# Patient Record
Sex: Male | Born: 1980
Health system: Southern US, Community
[De-identification: ages and names within clinical notes are randomized; demographics above are authoritative.]

## PROBLEM LIST (undated history)

## (undated) DIAGNOSIS — G4733 Obstructive sleep apnea (adult) (pediatric): Secondary | ICD-10-CM

## (undated) DIAGNOSIS — Z86018 Personal history of other benign neoplasm: Secondary | ICD-10-CM

## (undated) DIAGNOSIS — C4491 Basal cell carcinoma of skin, unspecified: Secondary | ICD-10-CM

## (undated) DIAGNOSIS — Z85828 Personal history of other malignant neoplasm of skin: Secondary | ICD-10-CM

## (undated) DIAGNOSIS — Z8619 Personal history of other infectious and parasitic diseases: Secondary | ICD-10-CM

## (undated) DIAGNOSIS — J309 Allergic rhinitis, unspecified: Secondary | ICD-10-CM

## (undated) DIAGNOSIS — R319 Hematuria, unspecified: Secondary | ICD-10-CM

## (undated) HISTORY — DX: Personal history of other infectious and parasitic diseases: Z86.19

## (undated) HISTORY — DX: Hematuria, unspecified: R31.9

## (undated) HISTORY — DX: Allergic rhinitis, unspecified: J30.9

## (undated) HISTORY — DX: Obstructive sleep apnea (adult) (pediatric): G47.33

## (undated) HISTORY — DX: Personal history of other malignant neoplasm of skin: Z85.828

## (undated) HISTORY — DX: Basal cell carcinoma of skin, unspecified: C44.91

## (undated) HISTORY — DX: Personal history of other benign neoplasm: Z86.018

---
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
## 2006-01-29 ENCOUNTER — Emergency Department: Payer: Self-pay | Admitting: Internal Medicine

## 2007-04-18 ENCOUNTER — Emergency Department: Payer: Self-pay | Admitting: Emergency Medicine

## 2009-01-11 ENCOUNTER — Emergency Department: Payer: Self-pay | Admitting: Emergency Medicine

## 2011-01-21 IMAGING — CR RIGHT RING FINGER 2+V
1 series · 3 of 3 positions shown · non-contrast
Comparison: none

REASON FOR EXAM: injury
COMMENTS:

PROCEDURE:     DXR - DXR FINGER RING 4TH DIGIT RT UA  - January 11, 2009 [DATE]
RESULT:     Comparison:  None

[Series 1: view not recorded · 0.17mm/px · 3 of 3 slices shown]
[im 1/3]
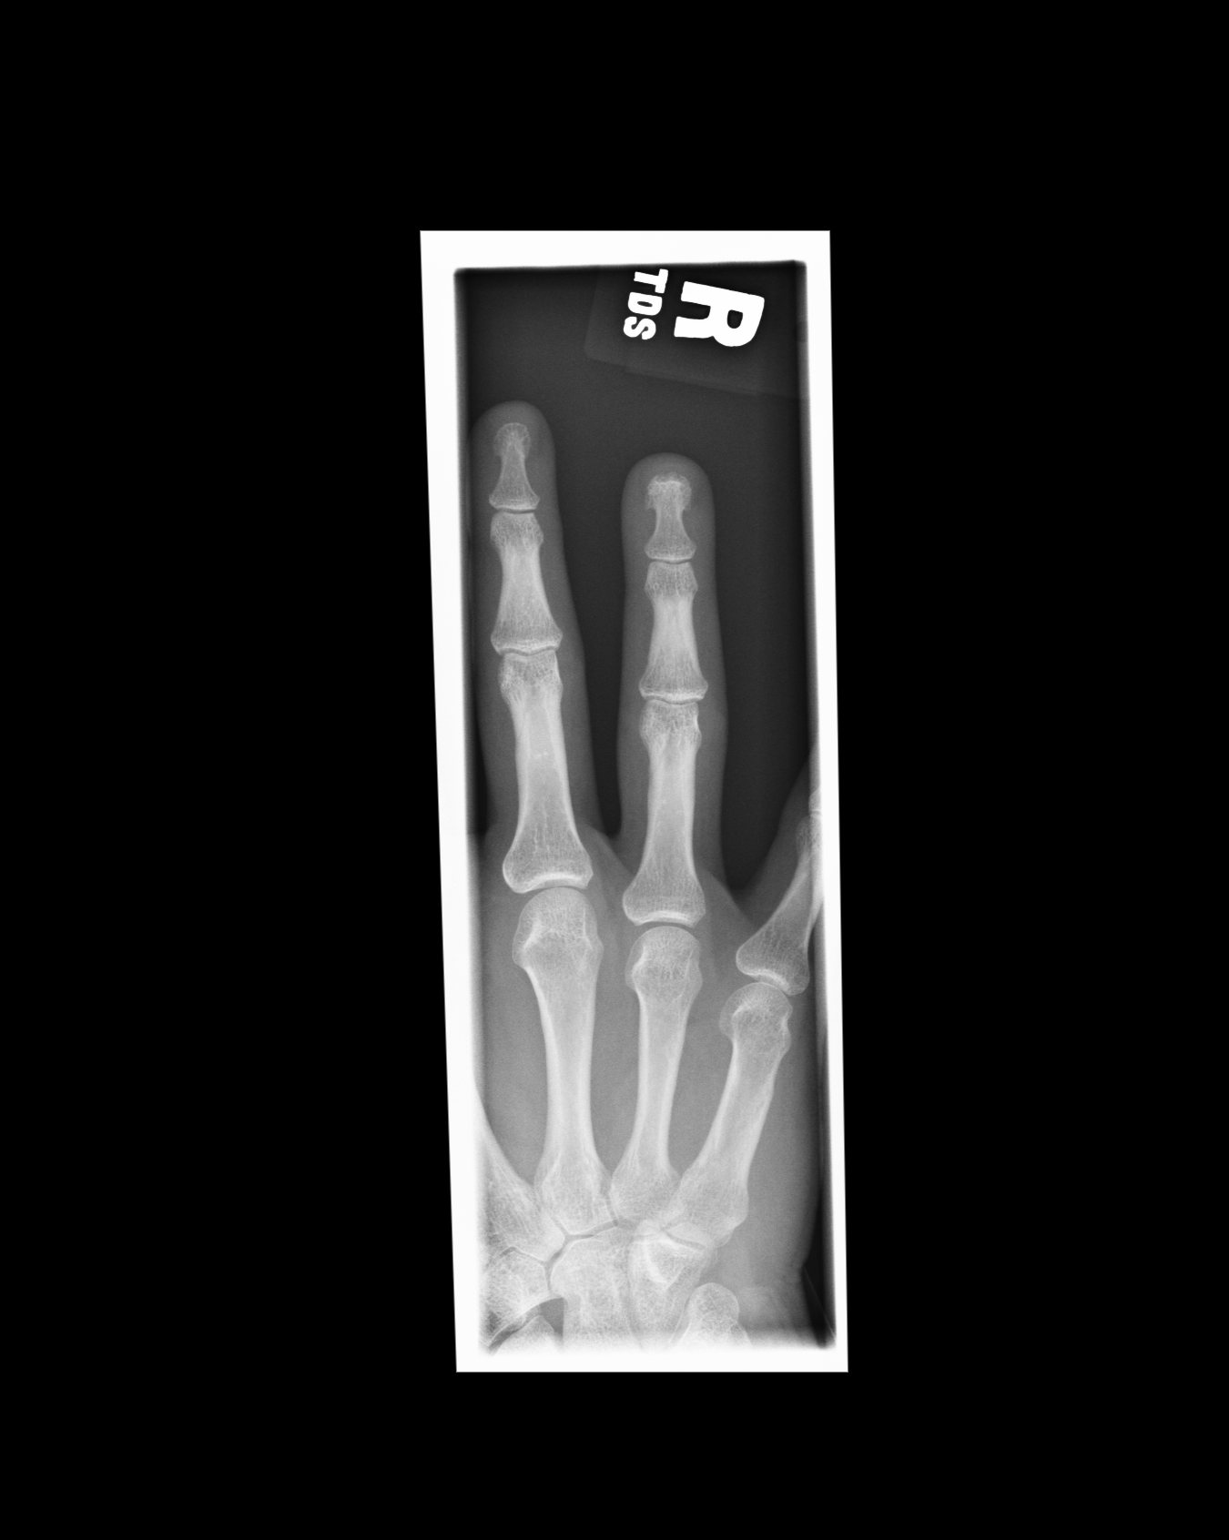
[im 2/3]
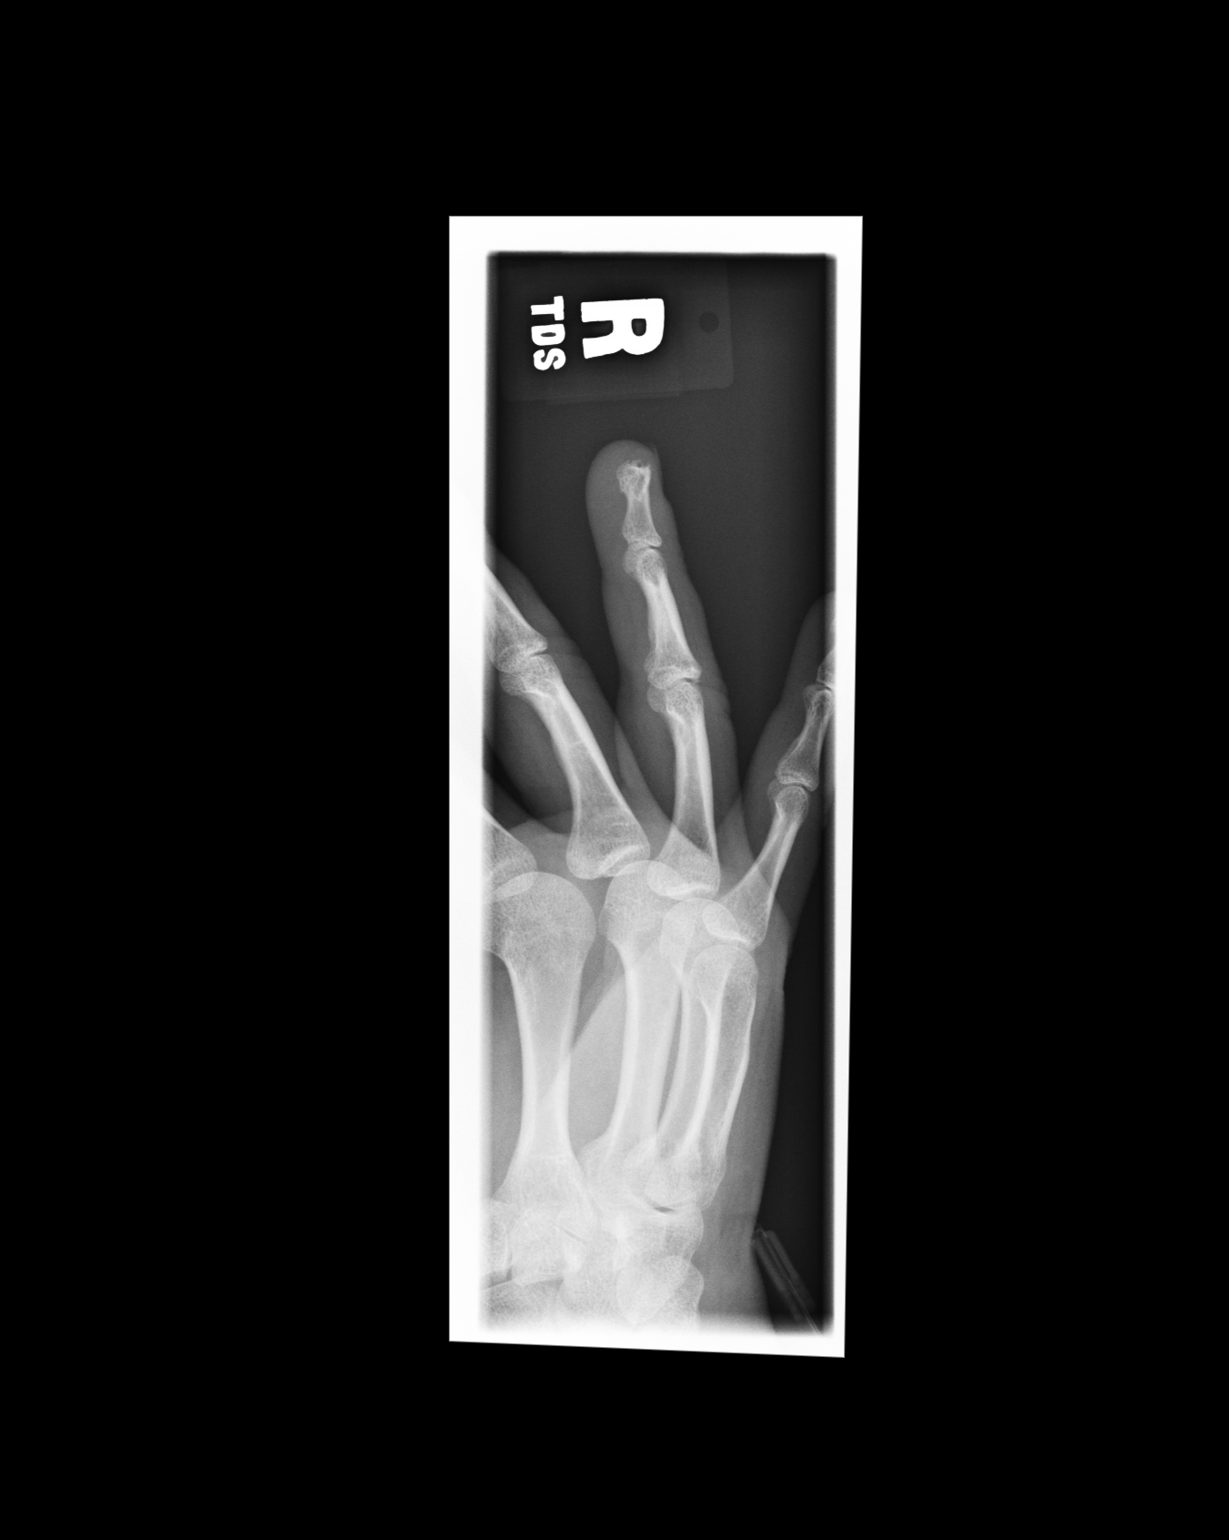
[im 3/3]
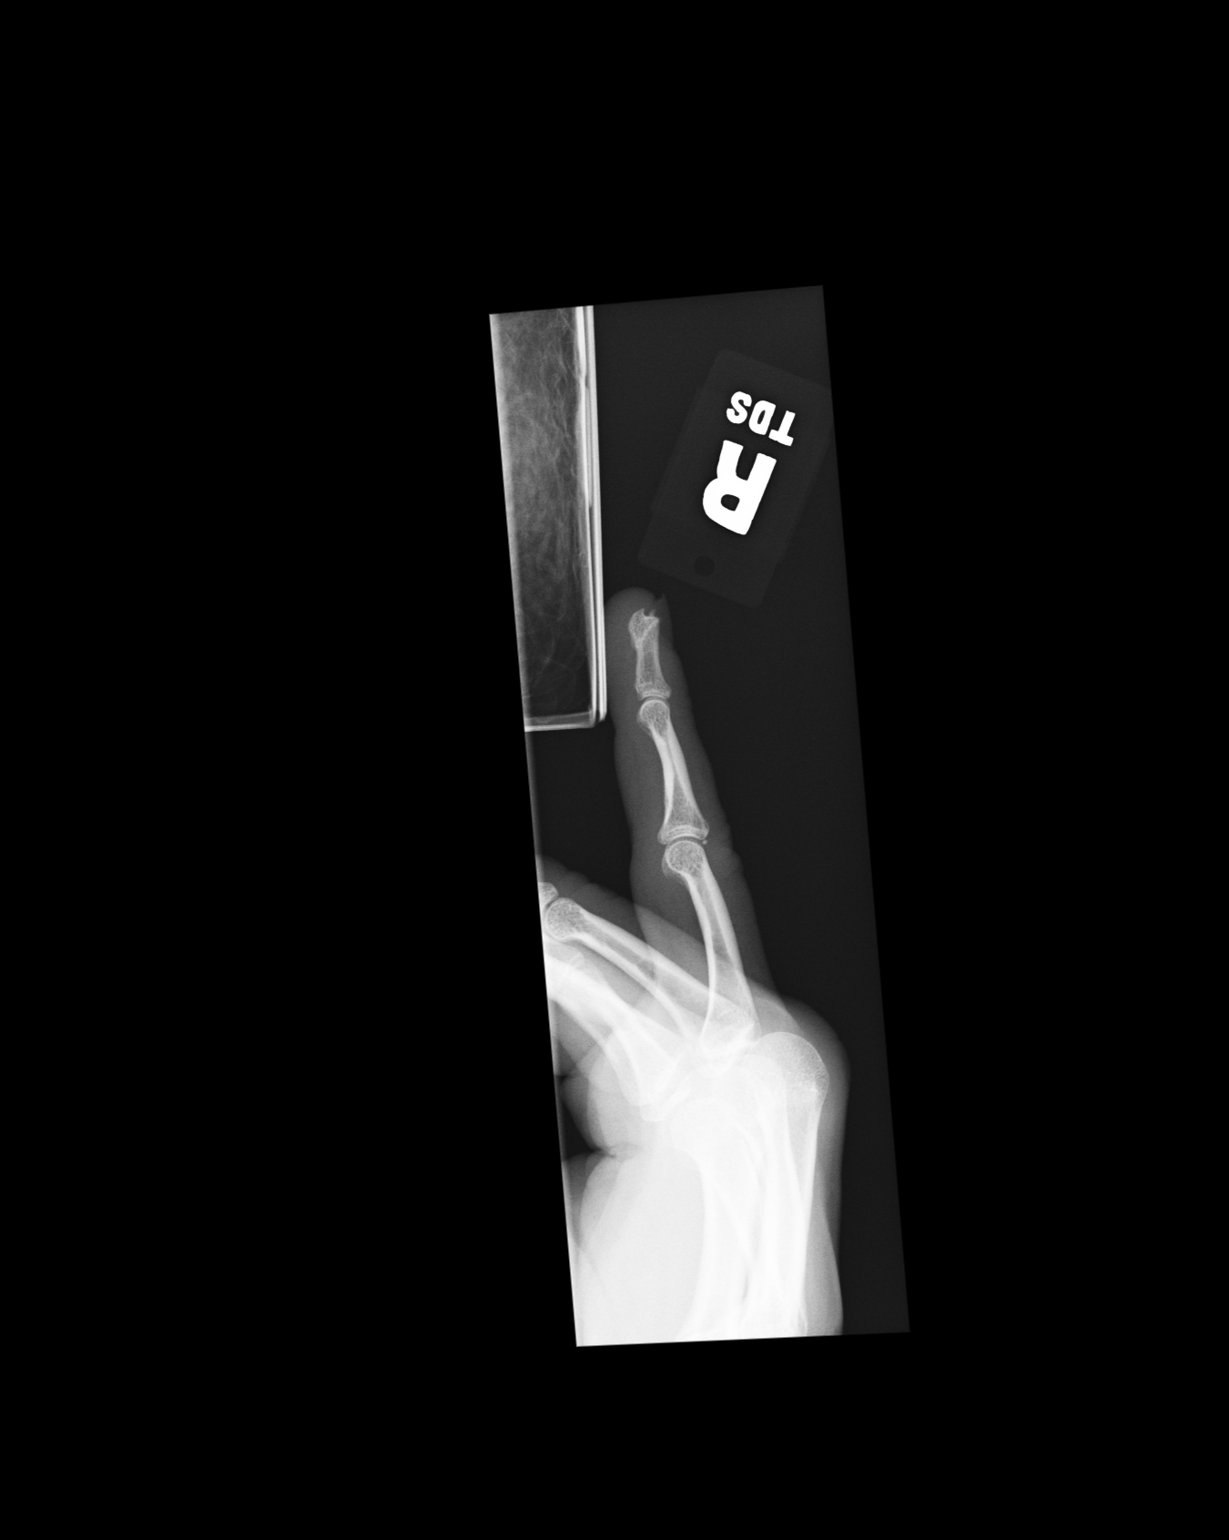

[3 of 3 positions shown; findings below may reference images not displayed]

FINDINGS: Three coned-down views of the right fourth digit demonstrates no fracture or
dislocation. There is a well-corticated scalloping along the dorsal tip of
the fourth distal phalanx which may represent an epidermal inclusion cyst.
The soft tissues are normal.
IMPRESSION: No acute osseous injury of the right fourth digit .

There is a well-corticated scalloping along the dorsal tip of the fourth
distal phalanx which may represent an epidermal inclusion cyst. If there is
further clinical concern recommend evaluation with MRI.

## 2012-05-21 ENCOUNTER — Telehealth: Payer: Self-pay | Admitting: Family Medicine

## 2012-05-21 NOTE — Telephone Encounter (Signed)
Pt's wife called and says pt needs to be est w/a PCP. He currently has a new pt appmt scheduled for 07/20/2012, however, he needs a DOT physical prior to 06/05/2012. Would you be able to accommodate him a new pt and DOT CPE apptmt prior to 06/05/2012 on the same day? Thank you.

## 2012-05-21 NOTE — Telephone Encounter (Signed)
We could place him tomorrow at 4pm

## 2012-05-22 ENCOUNTER — Ambulatory Visit (INDEPENDENT_AMBULATORY_CARE_PROVIDER_SITE_OTHER): Payer: BC Managed Care – PPO | Admitting: Family Medicine

## 2012-05-22 ENCOUNTER — Encounter: Payer: Self-pay | Admitting: Family Medicine

## 2012-05-22 VITALS — BP 110/80 | HR 84 | Temp 97.8°F | Ht 73.0 in | Wt 221.8 lb

## 2012-05-22 DIAGNOSIS — E669 Obesity, unspecified: Secondary | ICD-10-CM

## 2012-05-22 DIAGNOSIS — Z0000 Encounter for general adult medical examination without abnormal findings: Secondary | ICD-10-CM

## 2012-05-22 DIAGNOSIS — Z0279 Encounter for issue of other medical certificate: Secondary | ICD-10-CM

## 2012-05-22 LAB — POCT URINALYSIS DIPSTICK
Bilirubin, UA: NEGATIVE
Blood, UA: NEGATIVE
Glucose, UA: NEGATIVE
Ketones, UA: NEGATIVE
Spec Grav, UA: 1.01

## 2012-05-22 NOTE — Telephone Encounter (Signed)
Appt scheduled

## 2012-05-22 NOTE — Patient Instructions (Signed)
Good to meet you today. Call us with quesitons. Form filled out today. Return as needed or for next physical (although we will stop offering DOT physicals later this year.)

## 2012-05-22 NOTE — Progress Notes (Signed)
Subjective:    Patient ID: Francisco Mendez, male    DOB: October 19, 1980, 32 y.o.   MRN: [id number]  HPI CC: new pt, DOT   Francisco Mendez is here as a new patient to establish care.  He requests CPE for DOT CDL. Just finished cipro for bronchitis.    Body mass index is 29.26 kg/(m^2).   Caffeine: 2 cups soda/day Lives with wife, 1 daughter (2011), 2 dogs Occupation: Emergency planning/management officer Edu: HS Activity: goes to gym intermittently Diet: good water, not much fruits/vegetables  Preventative: No recent CPE Flu shot - declines Tetanus - unsure, thinks recently (2-3 yrs) at least 2008  Seat belt use discussed. Sunscreen use discussed.  Medications and allergies reviewed and updated in chart.  Past histories reviewed and updated if relevant as below. There is no problem list on file for this patient.  Past Medical History  Diagnosis Date  . History of chicken pox    Past Surgical History  Procedure Date  . Cystoscopy 2001    for hematuria, WNL   History  Substance Use Topics  . Smoking status: Never Smoker   . Smokeless tobacco: Current User    Types: Snuff     Comment: 1 can/day  . Alcohol Use: Yes     Comment: Occasional   Family History  Problem Relation Age of Onset  . Alzheimer's disease Paternal Grandfather   . CAD Maternal Grandmother   . CAD Maternal Grandfather   . Cancer Neg Hx   . Diabetes Neg Hx    Allergies  Allergen Reactions  . Keflex (Cephalexin) Rash    hives   No current outpatient prescriptions on file prior to visit.     Review of Systems  Constitutional: Negative for fever, chills, activity change, appetite change, fatigue and unexpected weight change.  HENT: Negative for hearing loss and neck pain.   Eyes: Negative for visual disturbance.  Respiratory: Positive for cough (bronchitis). Negative for chest tightness, shortness of breath and wheezing.   Cardiovascular: Negative for chest pain, palpitations and leg swelling.  Gastrointestinal: Negative for  nausea, vomiting, abdominal pain, diarrhea, constipation, blood in stool and abdominal distention.  Genitourinary: Negative for hematuria and difficulty urinating.  Musculoskeletal: Negative for myalgias and arthralgias.  Skin: Negative for rash.  Neurological: Negative for dizziness, seizures, syncope and headaches.  Hematological: Does not bruise/bleed easily.  Psychiatric/Behavioral: Negative for dysphoric mood. The patient is not nervous/anxious.        Objective:   Physical Exam  Nursing note and vitals reviewed. Constitutional: He is oriented to person, place, and time. He appears well-developed and well-nourished. No distress.  HENT:  Head: Normocephalic and atraumatic.  Right Ear: Hearing, tympanic membrane, external ear and ear canal normal.  Left Ear: Hearing, tympanic membrane, external ear and ear canal normal.  Nose: Nose normal.  Mouth/Throat: Uvula is midline and oropharynx is clear and moist. No oropharyngeal exudate, posterior oropharyngeal edema, posterior oropharyngeal erythema or tonsillar abscesses.  Eyes: Conjunctivae normal and EOM are normal. Pupils are equal, round, and reactive to light. No scleral icterus.       R cherry angioma inferior eyelid  Neck: Normal range of motion. Neck supple. No thyromegaly present.  Cardiovascular: Normal rate, regular rhythm, normal heart sounds and intact distal pulses.   No murmur heard. Pulses:      Radial pulses are 2+ on the right side, and 2+ on the left side.  Pulmonary/Chest: Effort normal and breath sounds normal. No respiratory distress. He has no wheezes.  He has no rales.  Abdominal: Soft. Bowel sounds are normal. He exhibits no distension and no mass. There is no tenderness. There is no rebound and no guarding.  Musculoskeletal: Normal range of motion. He exhibits no edema.  Lymphadenopathy:    He has no cervical adenopathy.  Neurological: He is alert and oriented to person, place, and time.       CN grossly  intact, station and gait intact  Skin: Skin is warm and dry. No rash noted.  Psychiatric: He has a normal mood and affect. His behavior is normal. Judgment and thought content normal.      Assessment & Plan:

## 2012-05-22 NOTE — Assessment & Plan Note (Addendum)
Preventative protocols reviewed and updated unless pt declined. Discussed healthy diet and lifestyle. DOT form filled out. He will return at his convenience fasting for blood work. Will try to give urine sample.

## 2012-05-31 ENCOUNTER — Telehealth: Payer: Self-pay | Admitting: *Deleted

## 2012-05-31 NOTE — Telephone Encounter (Signed)
Wife, Toni Amend dropped off DOT card and ppw to be completed by Dr. Sharen Hones.  Please call when completed at [phone].

## 2012-06-01 NOTE — Telephone Encounter (Signed)
Patient notified and paperwork placed up front for pick up.  

## 2012-06-01 NOTE — Telephone Encounter (Signed)
IN your IN box for completion. Just needs signature on card.

## 2012-06-01 NOTE — Telephone Encounter (Signed)
filled and placed in kim's box 

## 2012-06-10 ENCOUNTER — Encounter: Payer: Self-pay | Admitting: Family Medicine

## 2012-07-20 ENCOUNTER — Ambulatory Visit: Payer: BC Managed Care – PPO | Admitting: Family Medicine

## 2013-04-12 ENCOUNTER — Ambulatory Visit: Payer: Self-pay | Admitting: Podiatry

## 2014-05-09 DIAGNOSIS — C4491 Basal cell carcinoma of skin, unspecified: Secondary | ICD-10-CM

## 2014-05-09 HISTORY — DX: Basal cell carcinoma of skin, unspecified: C44.91

## 2014-11-17 DIAGNOSIS — Z85828 Personal history of other malignant neoplasm of skin: Secondary | ICD-10-CM

## 2014-11-17 HISTORY — DX: Personal history of other malignant neoplasm of skin: Z85.828

## 2016-07-19 ENCOUNTER — Ambulatory Visit (HOSPITAL_COMMUNITY)
Admission: RE | Admit: 2016-07-19 | Discharge: 2016-07-19 | Disposition: A | Discharge status: Home / Self Care | Payer: BLUE CROSS/BLUE SHIELD | Source: Ambulatory Visit | Attending: Internal Medicine | Admitting: Internal Medicine

## 2016-07-19 ENCOUNTER — Encounter (HOSPITAL_COMMUNITY): Payer: Self-pay | Admitting: Internal Medicine

## 2016-07-19 VITALS — BP 122/82 | HR 77 | Wt 225.2 lb

## 2016-07-19 DIAGNOSIS — E782 Mixed hyperlipidemia: Secondary | ICD-10-CM | POA: Diagnosis not present

## 2016-07-19 DIAGNOSIS — R079 Chest pain, unspecified: Secondary | ICD-10-CM

## 2016-07-19 DIAGNOSIS — E785 Hyperlipidemia, unspecified: Secondary | ICD-10-CM | POA: Insufficient documentation

## 2016-07-19 DIAGNOSIS — R0789 Other chest pain: Secondary | ICD-10-CM | POA: Diagnosis not present

## 2016-07-19 DIAGNOSIS — R001 Bradycardia, unspecified: Secondary | ICD-10-CM | POA: Insufficient documentation

## 2016-07-19 DIAGNOSIS — R0683 Snoring: Secondary | ICD-10-CM | POA: Diagnosis not present

## 2016-07-19 DIAGNOSIS — Z8249 Family history of ischemic heart disease and other diseases of the circulatory system: Secondary | ICD-10-CM | POA: Diagnosis not present

## 2016-07-19 LAB — EXERCISE TOLERANCE TEST
CSEPHR: 107 %
CSEPPHR: 200 {beats}/min
Estimated workload: 12.5 METS
Exercise duration (min): 10 min
Exercise duration (sec): 31 s
MPHR: 186 {beats}/min
RPE: 18
Rest HR: 68 {beats}/min

## 2016-07-19 LAB — COMPREHENSIVE METABOLIC PANEL
ALBUMIN: 4.4 g/dL (ref 3.5–5.0)
ALT: 36 U/L (ref 17–63)
AST: 31 U/L (ref 15–41)
Alkaline Phosphatase: 84 U/L (ref 38–126)
Anion gap: 11 (ref 5–15)
BUN: 12 mg/dL (ref 6–20)
CHLORIDE: 97 mmol/L — AB (ref 101–111)
CO2: 27 mmol/L (ref 22–32)
CREATININE: 0.88 mg/dL (ref 0.61–1.24)
Calcium: 9.6 mg/dL (ref 8.9–10.3)
GFR calc non Af Amer: >60 mL/min (ref >60)
Glucose, Bld: 100 mg/dL — ABNORMAL HIGH (ref 65–99)
Potassium: 4.1 mmol/L (ref 3.5–5.1)
SODIUM: 135 mmol/L (ref 135–145)
Total Bilirubin: 1.4 mg/dL — ABNORMAL HIGH (ref 0.3–1.2)
Total Protein: 8.2 g/dL — ABNORMAL HIGH (ref 6.5–8.1)

## 2016-07-19 LAB — CBC
HCT: 44.8 % (ref 39.0–52.0)
Hemoglobin: 15.5 g/dL (ref 13.0–17.0)
MCH: 29.2 pg (ref 26.0–34.0)
MCHC: 34.6 g/dL (ref 30.0–36.0)
MCV: 84.4 fL (ref 78.0–100.0)
PLATELETS: 280 10*3/uL (ref 150–400)
RBC: 5.31 MIL/uL (ref 4.22–5.81)
RDW: 12.8 % (ref 11.5–15.5)
WBC: 5.5 10*3/uL (ref 4.0–10.5)

## 2016-07-19 LAB — TROPONIN I

## 2016-07-19 LAB — CHOLESTEROL, TOTAL: Cholesterol: 285 mg/dL — ABNORMAL HIGH (ref 0–200)

## 2016-07-19 LAB — D-DIMER, QUANTITATIVE (NOT AT ARMC): D DIMER QUANT: 0.44 ug{FEU}/mL (ref 0.00–0.50)

## 2016-07-19 MED ORDER — PANTOPRAZOLE SODIUM 40 MG PO TBEC: 40.0000 mg | DELAYED_RELEASE_TABLET | Freq: Every day | ORAL | 11 refills | Status: DC

## 2016-07-19 NOTE — Progress Notes (Signed)
CARDIOLOGY CLINIC CONSULT NOTE  Referring Physician: Primary Care: Primary Cardiologist:  HPI:  36 y/o male with no significant PMHx (brother-in-law of Kevan Rosebush) presents for further evaluation of chest pressure.   Denies any h/o known heart disease. No HTN. +HL (TGs > 500). Non-smoker.   Has been doing ok. Goes to gym 2-3x. Rides spin bike for 15. Lifts weights and then does elliptical for 25 mins hard. No CP or SOB. Last week was lying in bed and got tightness in his chest. Some pain in his arm. Has had indigestion in past but this was more severe. No SOB, diaphoresis. Over past two days has had tightness in his chest. No change with exertion or eating. No dark stools. No recent travel. + burning in his throat.   Snores severely.   Trop < 0.03  D-dimer 0.44   FHx:  Maternal uncle: MI in 62s (smoker) MGF: MI at older age (smoker) MGM: MI at older age  Sister: 95 and healthy Father: 20 ok Mother: 40 high cholesterol   Review of Systems: [y] = yes, [ ]  = no   General: Weight gain [ ] ; Weight loss [ ] ; Anorexia [ ] ; Fatigue [ ] ; Fever [ ] ; Chills [ ] ; Weakness [ ]   Cardiac: Chest pain/pressure Blue.Reese ]; Resting SOB [ ] ; Exertional SOB [ ] ; Orthopnea [ ] ; Pedal Edema [ ] ; Palpitations [ ] ; Syncope [ ] ; Presyncope [ ] ; Paroxysmal nocturnal dyspnea[ ]   Pulmonary: Cough [ ] ; Wheezing[ ] ; Hemoptysis[ ] ; Sputum [ ] ; Snoring [ ]   GI: Vomiting[ ] ; Dysphagia[ ] ; Melena[ ] ; Hematochezia [ ] ; Heartburn[ ] ; Abdominal pain [ ] ; Constipation [ ] ; Diarrhea [ ] ; BRBPR [ ]   GU: Hematuria[ ] ; Dysuria [ ] ; Nocturia[ ]   Vascular: Pain in legs with walking [ ] ; Pain in feet with lying flat [ ] ; Non-healing sores [ ] ; Stroke [ ] ; TIA [ ] ; Slurred speech [ ] ;  Neuro: Headaches[ ] ; Vertigo[ ] ; Seizures[ ] ; Paresthesias[ ] ;Blurred vision [ ] ; Diplopia [ ] ; Vision changes [ ]   Ortho/Skin: Arthritis [ ] ; Joint pain [ ] ; Muscle pain [ ] ; Joint swelling [ ] ; Back Pain [ ] ; Rash [ ]   Psych: Depression[  ]; Anxiety[ ]   Heme: Bleeding problems [ ] ; Clotting disorders [ ] ; Anemia [ ]   Endocrine: Diabetes [ ] ; Thyroid dysfunction[ ]    Past Medical History:  Diagnosis Date  . Allergic rhinitis   . Hematuria ~2001   normal workup with cystoscopy and CT  . History of chicken pox     No current outpatient prescriptions on file.   No current facility-administered medications for this encounter.     Allergies  Allergen Reactions  . Keflex [Cephalexin] Rash    hives      Social History   Social History  . Marital status: Married    Spouse name: N/A  . Number of children: N/A  . Years of education: N/A   Occupational History  . Not on file.   Social History Main Topics  . Smoking status: Never Smoker  . Smokeless tobacco: Current User    Types: Snuff     Comment: 1 can/day  . Alcohol use Yes     Comment: Occasional  . Drug use: No  . Sexual activity: Not on file   Other Topics Concern  . Not on file   Social History Narrative   Caffeine: 2 cups soda/day   Lives with wife, 1 daughter, 2 dogs   Occupation:  police officer   Edu: HS   Activity: goes to gym intermittently   Diet: good water, not much fruits/vegetables      Family History  Problem Relation Age of Onset  . Alzheimer's disease Paternal Grandfather   . CAD Maternal Grandmother   . CAD Maternal Grandfather   . Cancer Neg Hx   . Diabetes Neg Hx     Vitals:   07/19/16 1117  BP: 122/82  Pulse: 77  SpO2: 97%  Weight: 225 lb 4 oz (102.2 kg)    PHYSICAL EXAM: General:  Well appearing. Muscular. No respiratory difficulty HEENT: normal Neck: supple. no JVD. Carotids 2+ bilat; no bruits. No lymphadenopathy or thryomegaly appreciated. Cor: PMI nondisplaced. Regular rate & rhythm. No rubs, gallops or murmurs. Lungs: clear Abdomen: soft, nontender, nondistended. No hepatosplenomegaly. No bruits or masses. Good bowel sounds. Extremities: no cyanosis, clubbing, rash, edema Neuro: alert & oriented x 3,  cranial nerves grossly intact. moves all 4 extremities w/o difficulty. Affect pleasant.  ECG: NSR 66 No ST-T wave abnormalities.    ASSESSMENT & PLAN: 1. Chest tightness with + FHX for early CAD - suspect symptoms are related to GERD but given strong FHx will proceed with ETT to further risk stratify. If symptoms persist can consider coronary CTA -start potonix 40 daily 2. Snoring -likely OSA. Will try to lose 15 pounds and see response. If persists, will need sleep study 3. Hyperlipidemia -check lipid panel and HgbA1c.   Emeka Lindner,MD 3:18 PM

## 2016-07-19 NOTE — Progress Notes (Signed)
Pt here for GXT.  Pt did well with GXT, no chest pain with exertion.  ECG not acute. Dr Haroldine Laws in to see pt.  Lenoard Aden 07/19/2016 2:35 PM Beeper [phone]

## 2016-07-20 LAB — HEMOGLOBIN A1C
HEMOGLOBIN A1C: 5.4 % (ref 4.8–5.6)
MEAN PLASMA GLUCOSE: 108 mg/dL

## 2016-07-25 ENCOUNTER — Other Ambulatory Visit: Payer: Self-pay

## 2016-07-25 DIAGNOSIS — E782 Mixed hyperlipidemia: Secondary | ICD-10-CM

## 2016-07-26 LAB — LIPID PANEL
CHOL/HDL RATIO: 8.6 ratio — AB (ref 0.0–5.0)
Cholesterol, Total: 259 mg/dL — ABNORMAL HIGH (ref 100–199)
HDL: 30 mg/dL — AB (ref >39)
Triglycerides: 663 mg/dL (ref 0–149)

## 2016-07-28 ENCOUNTER — Telehealth (HOSPITAL_COMMUNITY): Payer: Self-pay | Admitting: *Deleted

## 2016-07-28 MED ORDER — FISH OIL 1000 MG PO CAPS
2000.0000 mg | ORAL_CAPSULE | Freq: Every day | ORAL | 0 refills | Status: DC
Start: 2016-07-28 — End: 2017-06-19

## 2016-07-28 MED ORDER — ROSUVASTATIN CALCIUM 20 MG PO TABS: 20.0000 mg | ORAL_TABLET | Freq: Every day | ORAL | 6 refills | Status: DC

## 2016-07-28 NOTE — Telephone Encounter (Signed)
Notes recorded by Scarlette Calico, RN on 07/28/2016 at 4:31 PM EDT Per Dr Haroldine Laws pt needs Crestor 20 mg daily, fish oil 2 gm daily, decrease sugar intake, f/u w/pcp may need referral to lipid clinic. Pt aware of results and recommendations. He states he is taking Fish oil 2 gm already, rx for Crestor sent in, will let Dr Haroldine Laws know about fish oil.

## 2016-08-06 NOTE — Addendum Note (Signed)
Encounter addended by: Jolaine Artist, MD on: 08/06/2016 11:38 PM<BR>    Actions taken: LOS modified

## 2016-08-06 NOTE — Addendum Note (Signed)
Encounter addended by: Jolaine Artist, MD on: 08/06/2016 11:37 PM<BR>    Actions taken: LOS modified

## 2016-08-07 DIAGNOSIS — G4733 Obstructive sleep apnea (adult) (pediatric): Secondary | ICD-10-CM

## 2016-08-07 HISTORY — DX: Obstructive sleep apnea (adult) (pediatric): G47.33

## 2016-08-11 ENCOUNTER — Encounter: Payer: Self-pay | Admitting: Family Medicine

## 2016-08-17 ENCOUNTER — Encounter: Payer: Self-pay | Admitting: Family Medicine

## 2016-08-17 ENCOUNTER — Ambulatory Visit (INDEPENDENT_AMBULATORY_CARE_PROVIDER_SITE_OTHER): Payer: BLUE CROSS/BLUE SHIELD | Admitting: Family Medicine

## 2016-08-17 VITALS — BP 110/76 | HR 60 | Temp 98.1°F | Ht 73.0 in | Wt 223.5 lb

## 2016-08-17 DIAGNOSIS — E781 Pure hyperglyceridemia: Secondary | ICD-10-CM

## 2016-08-17 DIAGNOSIS — K219 Gastro-esophageal reflux disease without esophagitis: Secondary | ICD-10-CM | POA: Insufficient documentation

## 2016-08-17 DIAGNOSIS — Z0000 Encounter for general adult medical examination without abnormal findings: Secondary | ICD-10-CM | POA: Diagnosis not present

## 2016-08-17 DIAGNOSIS — E785 Hyperlipidemia, unspecified: Secondary | ICD-10-CM | POA: Insufficient documentation

## 2016-08-17 NOTE — Assessment & Plan Note (Signed)
Preventative protocols reviewed and updated unless pt declined. Discussed healthy diet and lifestyle.  

## 2016-08-17 NOTE — Assessment & Plan Note (Signed)
Marked elevated trig, low HDL. Continues fish oil 2000mg  daily. Has been started on crestor 20mg  daily by cardiology. rec rpt FLP with cards or myself in 2-3 months - pt will check about f/u plan and let us know if he'd like to come here for labs.

## 2016-08-17 NOTE — Patient Instructions (Addendum)
Continue current medicines. Check with Dr B about follow up here or with him.  You are doing well today.  Return as needed or in 1-2 years for next physical.   Health Maintenance, Male A healthy lifestyle and preventive care is important for your health and wellness. Ask your health care provider about what schedule of regular examinations is right for you. What should I know about weight and diet?  Eat a Healthy Diet  Eat plenty of vegetables, fruits, whole grains, low-fat dairy products, and lean protein.  Do not eat a lot of foods high in solid fats, added sugars, or salt. Maintain a Healthy Weight  Regular exercise can help you achieve or maintain a healthy weight. You should:  Do at least 150 minutes of exercise each week. The exercise should increase your heart rate and make you sweat (moderate-intensity exercise).  Do strength-training exercises at least twice a week. Watch Your Levels of Cholesterol and Blood Lipids  Have your blood tested for lipids and cholesterol every 5 years starting at 34 years of age. If you are at high risk for heart disease, you should start having your blood tested when you are 34 years old. You may need to have your cholesterol levels checked more often if:  Your lipid or cholesterol levels are high.  You are older than 34 years of age.  You are at high risk for heart disease. What should I know about cancer screening? Many types of cancers can be detected early and may often be prevented. Lung Cancer  You should be screened every year for lung cancer if:  You are a current smoker who has smoked for at least 30 years.  You are a former smoker who has quit within the past 15 years.  Talk to your health care provider about your screening options, when you should start screening, and how often you should be screened. Colorectal Cancer  Routine colorectal cancer screening usually begins at 34 years of age and should be repeated every 5-10 years  until you are 34 years old. You may need to be screened more often if early forms of precancerous polyps or small growths are found. Your health care provider may recommend screening at an earlier age if you have risk factors for colon cancer.  Your health care provider may recommend using home test kits to check for hidden blood in the stool.  A small camera at the end of a tube can be used to examine your colon (sigmoidoscopy or colonoscopy). This checks for the earliest forms of colorectal cancer. Prostate and Testicular Cancer  Depending on your age and overall health, your health care provider may do certain tests to screen for prostate and testicular cancer.  Talk to your health care provider about any symptoms or concerns you have about testicular or prostate cancer. Skin Cancer  Check your skin from head to toe regularly.  Tell your health care provider about any new moles or changes in moles, especially if:  There is a change in a mole's size, shape, or color.  You have a mole that is larger than a pencil eraser.  Always use sunscreen. Apply sunscreen liberally and repeat throughout the day.  Protect yourself by wearing long sleeves, pants, a wide-brimmed hat, and sunglasses when outside. What should I know about heart disease, diabetes, and high blood pressure?  If you are 75-70 years of age, have your blood pressure checked every 3-5 years. If you are 25 years of age  or older, have your blood pressure checked every year. You should have your blood pressure measured twice-once when you are at a hospital or clinic, and once when you are not at a hospital or clinic. Record the average of the two measurements. To check your blood pressure when you are not at a hospital or clinic, you can use:  An automated blood pressure machine at a pharmacy.  A home blood pressure monitor.  Talk to your health care provider about your target blood pressure.  If you are between 57-79 years  old, ask your health care provider if you should take aspirin to prevent heart disease.  Have regular diabetes screenings by checking your fasting blood sugar level.  If you are at a normal weight and have a low risk for diabetes, have this test once every three years after the age of 45.  If you are overweight and have a high risk for diabetes, consider being tested at a younger age or more often.  A one-time screening for abdominal aortic aneurysm (AAA) by ultrasound is recommended for men aged 48-75 years who are current or former smokers. What should I know about preventing infection? Hepatitis B  If you have a higher risk for hepatitis B, you should be screened for this virus. Talk with your health care provider to find out if you are at risk for hepatitis B infection. Hepatitis C  Blood testing is recommended for:  Everyone born from [birth year] through 1963.  Anyone with known risk factors for hepatitis C. Sexually Transmitted Diseases (STDs)  You should be screened each year for STDs including gonorrhea and chlamydia if:  You are sexually active and are younger than 34 years of age.  You are older than 34 years of age and your health care provider tells you that you are at risk for this type of infection.  Your sexual activity has changed since you were last screened and you are at an increased risk for chlamydia or gonorrhea. Ask your health care provider if you are at risk.  Talk with your health care provider about whether you are at high risk of being infected with HIV. Your health care provider may recommend a prescription medicine to help prevent HIV infection. What else can I do?  Schedule regular health, dental, and eye exams.  Stay current with your vaccines (immunizations).  Do not use any tobacco products, such as cigarettes, chewing tobacco, and e-cigarettes. If you need help quitting, ask your health care provider.  Limit alcohol intake to no more than 2 drinks per  day. One drink equals 12 ounces of beer, 5 ounces of wine, or 1 ounces of hard liquor.  Do not use street drugs.  Do not share needles.  Ask your health care provider for help if you need support or information about quitting drugs.  Tell your health care provider if you often feel depressed.  Tell your health care provider if you have ever been abused or do not feel safe at home. This information is not intended to replace advice given to you by your health care provider. Make sure you discuss any questions you have with your health care provider. Document Released: 10/22/2007 Document Revised: 12/23/2015 Document Reviewed: 01/27/2015 Elsevier Interactive Patient Education  2017 [employer].

## 2016-08-17 NOTE — Assessment & Plan Note (Addendum)
Significant improvement after 1 month of protonix 40mg  daily. He has also implemented healthy diet changes. Will continue, discussed possible tapering.

## 2016-08-17 NOTE — Progress Notes (Signed)
Pre visit review using our clinic review tool, if applicable. No additional management support is needed unless otherwise documented below in the visit note. 

## 2016-08-17 NOTE — Progress Notes (Signed)
BP 110/76   Pulse 60   Temp 98.1 F (36.7 C) (Oral)   Ht 6\' 1"  (1.854 m)   Wt 223 lb 8 oz (101.4 kg)   BMI 29.49 kg/m    CC: CPE Subjective:    Patient ID: Francisco Mendez, male    DOB: 07/17/1980, 36 y.o.   MRN: [id number]  HPI: Francisco Mendez is a 36 y.o. male presenting on 08/17/2016 for Annual Exam   Last seen 2014.  Saw cardiology Dr Haroldine Laws last month for chest pressure with fmhx CAD - ETT was reassuring. Thought GERD related - started on protonix with significant improvement. For hypertriglyceridemia - started on crestor, continued fish oil.   Planned sleep study later this month. Has been snoring.   Preventative: Flu shot - declines Tetanus - thinks ~2008 Seat belt use discussed. Sunscreen use discussed. No changing moles on skin. h/o BCC removed from face.  Non smoker.  Alcohol - rare.   Caffeine: 2 cups soda/day Lives with wife, 1 daughter (2011), 2 dogs Occupation: Engineer, structural Edu: HS Activity: goes to gym 2-3 times weekly Diet: good water, not much fruits/vegetables, eats meats and potatoes  Relevant past medical, surgical, family and social history reviewed and updated as indicated. Interim medical history since our last visit reviewed. Allergies and medications reviewed and updated. Outpatient Medications Prior to Visit  Medication Sig Dispense Refill  . Omega-3 Fatty Acids (FISH OIL) 1000 MG CAPS Take 2 capsules (2,000 mg total) by mouth daily.  0  . pantoprazole (PROTONIX) 40 MG tablet Take 1 tablet (40 mg total) by mouth daily. 30 tablet 11  . rosuvastatin (CRESTOR) 20 MG tablet Take 1 tablet (20 mg total) by mouth daily. 30 tablet 6   No facility-administered medications prior to visit.      Per HPI unless specifically indicated in ROS section below Review of Systems  Constitutional: Negative for activity change, appetite change, chills, fatigue, fever and unexpected weight change.  HENT: Negative for hearing loss.   Eyes: Negative for visual  disturbance.  Respiratory: Positive for chest tightness (s/p cards eval - thought GERD related). Negative for cough, shortness of breath and wheezing.   Cardiovascular: Negative for chest pain, palpitations and leg swelling.  Gastrointestinal: Negative for abdominal distention, abdominal pain, blood in stool, constipation, diarrhea, nausea and vomiting.  Genitourinary: Negative for difficulty urinating and hematuria.  Musculoskeletal: Negative for arthralgias, myalgias and neck pain.  Skin: Negative for rash.  Neurological: Negative for dizziness, seizures, syncope and headaches.  Hematological: Negative for adenopathy. Does not bruise/bleed easily.  Psychiatric/Behavioral: Negative for dysphoric mood. The patient is not nervous/anxious.       Objective:    BP 110/76   Pulse 60   Temp 98.1 F (36.7 C) (Oral)   Ht 6\' 1"  (1.854 m)   Wt 223 lb 8 oz (101.4 kg)   BMI 29.49 kg/m   Wt Readings from Last 3 Encounters:  08/17/16 223 lb 8 oz (101.4 kg)  07/19/16 225 lb 4 oz (102.2 kg)  05/22/12 221 lb 12 oz (100.6 kg)    Physical Exam  Constitutional: He is oriented to person, place, and time. He appears well-developed and well-nourished. No distress.  HENT:  Head: Normocephalic and atraumatic.  Right Ear: Hearing, tympanic membrane, external ear and ear canal normal.  Left Ear: Hearing, tympanic membrane, external ear and ear canal normal.  Nose: Nose normal.  Mouth/Throat: Uvula is midline, oropharynx is clear and moist and mucous membranes are normal.  No oropharyngeal exudate, posterior oropharyngeal edema or posterior oropharyngeal erythema.  Eyes: Conjunctivae and EOM are normal. Pupils are equal, round, and reactive to light. No scleral icterus.  Neck: Normal range of motion. Neck supple. No thyromegaly present.  Cardiovascular: Normal rate, regular rhythm, normal heart sounds and intact distal pulses.   No murmur heard. Pulses:      Radial pulses are 2+ on the right side, and 2+  on the left side.  Pulmonary/Chest: Effort normal and breath sounds normal. No respiratory distress. He has no wheezes. He has no rales.  Abdominal: Soft. Bowel sounds are normal. He exhibits no distension and no mass. There is no tenderness. There is no rebound and no guarding.  Musculoskeletal: Normal range of motion. He exhibits no edema.  Lymphadenopathy:    He has no cervical adenopathy.  Neurological: He is alert and oriented to person, place, and time.  CN grossly intact, station and gait intact  Skin: Skin is warm and dry. No rash noted.  Psychiatric: He has a normal mood and affect. His behavior is normal. Judgment and thought content normal.  Nursing note and vitals reviewed.  Results for orders placed or performed in visit on 07/25/16  Lipid panel  Result Value Ref Range   Cholesterol, Total 259 (H) 100 - 199 mg/dL   Triglycerides 663 (HH) 0 - 149 mg/dL   HDL 30 (L) >39 mg/dL   VLDL Cholesterol Cal Comment 5 - 40 mg/dL   LDL Calculated Comment 0 - 99 mg/dL   Chol/HDL Ratio 8.6 (H) 0.0 - 5.0 ratio units      Assessment & Plan:   Problem List Items Addressed This Visit    GERD (gastroesophageal reflux disease)    Significant improvement after 1 month of protonix 40mg  daily. He has also implemented healthy diet changes. Will continue, discussed possible tapering.       Healthcare maintenance - Primary    Preventative protocols reviewed and updated unless pt declined. Discussed healthy diet and lifestyle.       Hypertriglyceridemia    Marked elevated trig, low HDL. Continues fish oil 2000mg  daily. Has been started on crestor 20mg  daily by cardiology. rec rpt FLP with cards or myself in 2-3 months - pt will check about f/u plan and let us know if he'd like to come here for labs.           Follow up plan: Return in about 1 year (around 08/17/2017) for annual exam, prior fasting for blood work.  Ria Bush, MD

## 2016-08-30 ENCOUNTER — Ambulatory Visit (HOSPITAL_BASED_OUTPATIENT_CLINIC_OR_DEPARTMENT_OTHER): Payer: BLUE CROSS/BLUE SHIELD | Attending: Internal Medicine | Admitting: Cardiology

## 2016-08-30 VITALS — Ht 73.0 in | Wt 220.0 lb

## 2016-08-30 DIAGNOSIS — R0683 Snoring: Secondary | ICD-10-CM

## 2016-08-30 DIAGNOSIS — R079 Chest pain, unspecified: Secondary | ICD-10-CM

## 2016-08-30 DIAGNOSIS — G4733 Obstructive sleep apnea (adult) (pediatric): Secondary | ICD-10-CM

## 2016-08-30 DIAGNOSIS — R5383 Other fatigue: Secondary | ICD-10-CM | POA: Insufficient documentation

## 2016-09-04 ENCOUNTER — Encounter: Payer: Self-pay | Admitting: Family Medicine

## 2016-09-04 NOTE — Procedures (Signed)
Patient Name: Francisco Mendez, Francisco Mendez Date: 08/30/2016 Gender: Male D.O.B: Nov 14, 1980 Age (years): 36 Referring Provider: Shaune Pascal Bensimhon Height (inches): 1 Interpreting Physician: Fransico Him MD, ABSM Weight (lbs): 220 RPSGT: Joni Reining BMI: 29 MRN: [id number] Neck Size: 16.50  CLINICAL INFORMATION Sleep Study Type: Split Night CPAP  Indication for sleep study: Excessive Daytime Sleepiness, Fatigue, Snoring, Witnessed Apneas  Epworth Sleepiness Score: 10  SLEEP STUDY TECHNIQUE As per the AASM Manual for the Scoring of Sleep and Associated Events v2.3 (April 2016) with a hypopnea requiring 4% desaturations.  The channels recorded and monitored were frontal, central and occipital EEG, electrooculogram (EOG), submentalis EMG (chin), nasal and oral airflow, thoracic and abdominal wall motion, anterior tibialis EMG, snore microphone, electrocardiogram, and pulse oximetry. Continuous positive airway pressure (CPAP) was initiated when the patient met split night criteria and was titrated according to treat sleep-disordered breathing.  MEDICATIONS Medications self-administered by patient taken the night of the study : N/A  RESPIRATORY PARAMETERS Diagnostic Total AHI (/hr): 22.0  RDI (/hr):22.8  OA Index (/hr): 2  CA Index (/hr): 7.6 REM AHI (/hr): 4.0  NREM AHI (/hr):24.0  Supine AHI (/hr):53.7  \Non-supine AHI (/hr): 8.54 Min O2 Sat (%):86.00  Mean O2 (%): 94.12  Time below 88% (min):0.8    Titration Optimal Pressure (cm):10  AHI at Optimal Pressure (/hr):1.6  Min O2 at Optimal Pressure (%):93.0 Supine % at Optimal (%):100  Sleep % at Optimal (%):97    SLEEP ARCHITECTURE The recording time for the entire night was 403.9 minutes.  During a baseline period of 241.4 minutes, the patient slept for 150.0 minutes in REM and nonREM, yielding a sleep efficiency of 62.1%. Sleep onset after lights out was 18.2 minutes with a REM latency of 167.0 minutes. The patient  spent 11.00% of the night in stage N1 sleep, 78.33% in stage N2 sleep, 0.67% in stage N3 and 10.00% in REM.  During the titration period of 157.1 minutes, the patient slept for 103.0 minutes in REM and nonREM, yielding a sleep efficiency of 65.6%. Sleep onset after CPAP initiation was 35.9 minutes with a REM latency of 92.0 minutes. The patient spent 13.11% of the night in stage N1 sleep, 58.74% in stage N2 sleep, 0.00% in stage N3 and 28.16% in REM.  CARDIAC DATA The 2 lead EKG demonstrated sinus rhythm. The mean heart rate was 54.42 beats per minute. Other EKG findings include: None.  LEG MOVEMENT DATA The total Periodic Limb Movements of Sleep (PLMS) were 8. The PLMS index was 1.90 .  IMPRESSIONS - Moderate obstructive sleep apnea occurred during the diagnostic portion of the study(AHI = 22.0/hour). An optimal PAP pressure was selected for this patient ( 10 cm of water) - Mild central sleep apnea occurred during the diagnostic portion of the study (CAI = 7.6/hour). - The patient had minimal or no oxygen desaturation during the diagnostic portion of the study (Min O2 = 86.00%) - The patient snored with Moderate snoring volume during the diagnostic portion of the study. - No cardiac abnormalities were noted during this study. - Clinically significant periodic limb movements did not occur during sleep. DIAGNOSIS - Obstructive Sleep Apnea (327.23 [G47.33 ICD-10])  RECOMMENDATIONS - Trial of CPAP therapy on 10 cm H2O with a Medium size Fisher&Paykel Full Face Mask Simplus mask and heated humidification. - Avoid alcohol, sedatives and other CNS depressants that may worsen sleep apnea and disrupt normal sleep architecture. - Sleep hygiene should be reviewed to assess factors that may improve sleep quality. -  Weight management and regular exercise should be initiated or continued. - Return to [facility] Center for re-evaluation after 10 weeks of therapy  [place], [place] of  [facility]  ELECTRONICALLY SIGNED ON:  09/04/2016, 10:04 PM [place] PH: [phone]   FX: [phone] [place]

## 2016-09-07 ENCOUNTER — Telehealth: Payer: Self-pay | Admitting: *Deleted

## 2016-09-07 NOTE — Telephone Encounter (Signed)
Informed patient of titration results and verbalized understanding was expressed. Patient understands Dr. Radford Pax has ordered him a ResMed CPAP at 10cm H2O with heated humidity and mask of choice. Patient understands he will be contacted by  [facility] to set up his cpap unit. Patient understands to call if [facility] does not contact him with new set-up in a timely manner. Patient understands he will be called once confirmation has been received from [facility] that he has a new machine to schedule his 10 week follow up appointment. [place] notified

## 2016-09-07 NOTE — Telephone Encounter (Signed)
-----   Message from Sueanne Margarita, MD sent at 09/04/2016 10:07 PM EDT ----- Please let patient know that they have significant sleep apnea and had successful CPAP titration and will be set up with CPAP unit.  Please let DME know that order is in EPIC.  Please set patient up for OV in 10 weeks

## 2016-09-12 ENCOUNTER — Encounter (HOSPITAL_COMMUNITY): Payer: BLUE CROSS/BLUE SHIELD | Admitting: Internal Medicine

## 2016-09-26 ENCOUNTER — Ambulatory Visit: Payer: Self-pay | Admitting: Medical

## 2016-09-26 VITALS — BP 120/75 | HR 83 | Temp 97.8°F | Resp 16 | Ht 73.0 in | Wt 220.0 lb

## 2016-09-26 DIAGNOSIS — S61211A Laceration without foreign body of left index finger without damage to nail, initial encounter: Secondary | ICD-10-CM

## 2016-09-26 NOTE — Progress Notes (Signed)
   Subjective:    Patient ID: Francisco Mendez, male    DOB: Jul 14, 1980, 36 y.o.   MRN: [id number]  HPI  Using hand saw and cut left index yesterday around  3pm.  Located on the dorsum of  left index finger ,able to flex and extend finger. No numbness or tingling. Not sure of last Tetanus vaccination. Allergy to Keflex but can take Penicillins.  Review of Systems  Constitutional: Negative for chills and fever.  HENT: Negative for congestion, sneezing and sore throat.   Eyes: Negative for discharge and itching.  Respiratory: Negative for chest tightness and wheezing.   Cardiovascular: Negative for chest pain.  Gastrointestinal: Negative for diarrhea, nausea and vomiting.  Endocrine: Negative for polydipsia, polyphagia and polyuria.  Genitourinary: Negative for hematuria.  Musculoskeletal: Negative for arthralgias and myalgias.  Skin: Negative for rash.  Allergic/Immunologic: Negative for environmental allergies and food allergies.  Neurological: Negative for dizziness and syncope.  Hematological: Negative for adenopathy.  Psychiatric/Behavioral: Negative for confusion and hallucinations.       Objective:   Physical Exam  Constitutional: He appears well-developed and well-nourished.  HENT:  Head: Normocephalic and atraumatic.  Nursing note and vitals reviewed.  <0.5 cm laceration , no subcutaneous tissue exposed.Located on dorsum of left index finger superior to the PIP on the lateral side.       Assessment & Plan:  Reviewed wound care with patient.Wash wound twice daily with dilute soap and water, neosporin  To site and a bandage to the area. Cleaned and triple antibiotic ointment applied with a bandage to finger. Updated patient Tetanus/diptheria. Patient to call if signs of redness, increased pain, pus, feverl and I will call in antibioitics for wound.  No questions at discharge, patient verbalizes understanding.

## 2016-10-27 ENCOUNTER — Encounter: Payer: Self-pay | Admitting: Cardiology

## 2016-11-07 ENCOUNTER — Telehealth: Payer: Self-pay | Admitting: *Deleted

## 2016-11-07 NOTE — Telephone Encounter (Signed)
-----   Message from Sueanne Margarita, MD sent at 11/03/2016  3:15 PM EDT ----- Good AHI and compliance.  Continue current CPAP settings.

## 2016-11-07 NOTE — Telephone Encounter (Signed)
Informed patient of compliance results and patient understanding was verbalized. Patient understands his settings will not change.

## 2016-12-12 ENCOUNTER — Encounter: Payer: Self-pay | Admitting: Cardiology

## 2016-12-16 ENCOUNTER — Ambulatory Visit (INDEPENDENT_AMBULATORY_CARE_PROVIDER_SITE_OTHER): Payer: BLUE CROSS/BLUE SHIELD | Admitting: Cardiology

## 2016-12-16 ENCOUNTER — Encounter: Payer: Self-pay | Admitting: Cardiology

## 2016-12-16 VITALS — BP 130/84 | HR 68 | Ht 73.0 in | Wt 232.4 lb

## 2016-12-16 DIAGNOSIS — G4733 Obstructive sleep apnea (adult) (pediatric): Secondary | ICD-10-CM | POA: Diagnosis not present

## 2016-12-16 DIAGNOSIS — E669 Obesity, unspecified: Secondary | ICD-10-CM

## 2016-12-16 DIAGNOSIS — E663 Overweight: Secondary | ICD-10-CM | POA: Insufficient documentation

## 2016-12-16 NOTE — Telephone Encounter (Signed)
Patient has a 10 week sleep follow up on 12/16/16. Patient understands he needs to keep this appointment for insurance compliance.

## 2016-12-16 NOTE — Patient Instructions (Signed)

## 2016-12-16 NOTE — Progress Notes (Signed)
Cardiology Office Note:    Date:  12/16/2016   ID:  Francisco Mendez, DOB [birth year], MRN [id number]  PCP:  Ria Bush, MD  Cardiologist:  Fransico Him, MD   Referring MD: Ria Bush, MD   Chief Complaint  Patient presents with  . Sleep Apnea    History of Present Illness:    Francisco Mendez is a 36 y.o. male with a hx of of OSA who is referred today by Dr. Haroldine Laws for evaluation of his sleep apnea.  He recently underwent PSG for excessive daytime sleepiness, snoring and witnessed apneic events and an Epworth sleepiness score of 10.  PSG showed moderate OSA with an AHI of 22/hr with oxygen desaturations as low as 86%.  He underwent CPAP titration to 10cm H2O.  He is now here for followup.  He is doing well with his CPAP device.  He tolerates the nasal pillow mask and feels the pressure is adequate.  Since going on CPAP he has had improvement in daytime sleepiness.  He feels more rested in the am.  He denies any significant  nasal dryness but occasionally has some mild mouth dryness.  He does not think he is snoring any more when using the CPAP.    Past Medical History:  Diagnosis Date  . Allergic rhinitis   . Basal cell carcinoma 2016  . Hematuria ~2001   normal workup with cystoscopy and CT  . History of chicken pox   . OSA (obstructive sleep apnea) 08/2016   sleep study AHI 22 ([facility])    Past Surgical History:  Procedure Laterality Date  . CYSTOSCOPY  2001   for hematuria, WNL    Current Medications: Current Meds  Medication Sig  . Multiple Vitamins-Minerals (MULTIVITAMIN ADULT PO) Take 1 tablet by mouth daily.  . Omega-3 Fatty Acids (FISH OIL) 1000 MG CAPS Take 2 capsules (2,000 mg total) by mouth daily.  . pantoprazole (PROTONIX) 40 MG tablet Take 1 tablet (40 mg total) by mouth daily.  . rosuvastatin (CRESTOR) 20 MG tablet Take 1 tablet (20 mg total) by mouth daily.     Allergies:   Keflex [cephalexin]   Social History   Social History  . Marital  status: Married    Spouse name: N/A  . Number of children: N/A  . Years of education: N/A   Social History Main Topics  . Smoking status: Never Smoker  . Smokeless tobacco: Former Systems developer    Types: Snuff  . Alcohol use Yes     Comment: Occasional  . Drug use: No  . Sexual activity: Not Asked   Other Topics Concern  . None   Social History Narrative   Caffeine: 2 cups soda/day   Lives with wife, 1 daughter, 2 dogs   Occupation: Engineer, structural   Edu: HS   Activity: goes to gym intermittently   Diet: good water, not much fruits/vegetables     Family History: The patient's family history includes Alzheimer's disease in his paternal grandfather; CAD in his maternal grandfather and maternal grandmother. There is no history of Cancer or Diabetes.  ROS:   Please see the history of present illness.     All other systems reviewed and are negative.  EKGs/Labs/Other Studies Reviewed:    The following studies were reviewed today: CPAP download  EKG:  EKG is not ordered today.    Recent Labs: 07/19/2016: ALT 36; BUN 12; Creatinine, Ser 0.88; Hemoglobin 15.5; Platelets 280; Potassium 4.1; Sodium 135   Recent Lipid  Panel    Component Value Date/Time   CHOL 259 (H) 07/25/2016 0919   TRIG 663 (HH) 07/25/2016 0919   HDL 30 (L) 07/25/2016 0919   CHOLHDL 8.6 (H) 07/25/2016 0919   LDLCALC Comment 07/25/2016 0919    Physical Exam:    VS:  BP 130/84   Pulse 68   Ht 6\' 1"  (1.854 m)   Wt 232 lb 6.4 oz (105.4 kg)   SpO2 95%   BMI 30.66 kg/m     Wt Readings from Last 3 Encounters:  12/16/16 232 lb 6.4 oz (105.4 kg)  09/26/16 220 lb (99.8 kg)  08/30/16 220 lb (99.8 kg)     GEN:  Well nourished, well developed in no acute distress HEENT: Normal NECK: No JVD; No carotid bruits LYMPHATICS: No lymphadenopathy CARDIAC: RRR, no murmurs, rubs, gallops RESPIRATORY:  Clear to auscultation without rales, wheezing or rhonchi  ABDOMEN: Soft, non-tender, non-distended MUSCULOSKELETAL:   No edema; No deformity  SKIN: Warm and dry NEUROLOGIC:  Alert and oriented x 3 PSYCHIATRIC:  Normal affect   ASSESSMENT:    1. OSA (obstructive sleep apnea)   2. Obesity (BMI 30-39.9)    PLAN:    In order of problems listed above:  OSA - the patient is tolerating PAP therapy well without any problems. The PAP download was reviewed today and showed an AHI of 2.2/hr on 10 cm H2O with 83% compliance in using more than 4 hours nightly.  The patient has been using and benefiting from CPAP use and will continue to benefit from therapy.  Obesity - I have encouraged him to continue in his exercise program and cut back on carbs and portions.     Medication Adjustments/Labs and Tests Ordered: Current medicines are reviewed at length with the patient today.  Concerns regarding medicines are outlined above.  No orders of the defined types were placed in this encounter.  No orders of the defined types were placed in this encounter.   Signed, Fransico Him, MD  12/16/2016 8:17 AM    [place]

## 2017-01-01 ENCOUNTER — Encounter: Payer: Self-pay | Admitting: Cardiology

## 2017-01-04 ENCOUNTER — Telehealth: Payer: Self-pay | Admitting: *Deleted

## 2017-01-04 NOTE — Telephone Encounter (Signed)
Informed patient of compliance results and verbalized understanding was indicated. nfPatient understands his events are in normal range and his compliance is good. Patient understands his current settings will not change. Patient was grateful for the call and thanked me.

## 2017-01-04 NOTE — Telephone Encounter (Signed)
-----   Message from Sueanne Margarita, MD sent at 01/03/2017  1:07 PM EDT ----- Good AHI and compliance.  Continue current CPAP settings.

## 2017-03-03 ENCOUNTER — Other Ambulatory Visit (HOSPITAL_COMMUNITY): Payer: Self-pay | Admitting: Internal Medicine

## 2017-03-03 DIAGNOSIS — E7849 Other hyperlipidemia: Secondary | ICD-10-CM

## 2017-03-06 NOTE — Telephone Encounter (Signed)
This is Dr. Clayborne Dana pt. Patient sees Dr. Radford Pax only for sleep and Dr. Radford Pax did not prescribe this medication. Please address

## 2017-03-07 NOTE — Telephone Encounter (Signed)
Refill sent in, pt is due for repeat L/L, orders placed pt aware and will come for labs 11/9

## 2017-03-16 ENCOUNTER — Ambulatory Visit (HOSPITAL_COMMUNITY)
Admission: RE | Admit: 2017-03-16 | Discharge: 2017-03-16 | Disposition: A | Discharge status: Home / Self Care | Payer: BLUE CROSS/BLUE SHIELD | Source: Ambulatory Visit | Attending: Internal Medicine | Admitting: Internal Medicine

## 2017-03-16 DIAGNOSIS — E7849 Other hyperlipidemia: Secondary | ICD-10-CM | POA: Diagnosis not present

## 2017-03-16 LAB — LIPID PANEL
Cholesterol: 151 mg/dL (ref 0–200)
HDL: 31 mg/dL — ABNORMAL LOW
LDL Cholesterol: 65 mg/dL (ref 0–99)
Total CHOL/HDL Ratio: 4.9 ratio
Triglycerides: 275 mg/dL — ABNORMAL HIGH
VLDL: 55 mg/dL — ABNORMAL HIGH (ref 0–40)

## 2017-03-16 LAB — HEPATIC FUNCTION PANEL
ALBUMIN: 4.3 g/dL (ref 3.5–5.0)
ALK PHOS: 85 U/L (ref 38–126)
ALT: 37 U/L (ref 17–63)
AST: 28 U/L (ref 15–41)
BILIRUBIN INDIRECT: 0.5 mg/dL (ref 0.3–0.9)
Bilirubin, Direct: 0.2 mg/dL (ref 0.1–0.5)
TOTAL PROTEIN: 7.9 g/dL (ref 6.5–8.1)
Total Bilirubin: 0.7 mg/dL (ref 0.3–1.2)

## 2017-06-19 ENCOUNTER — Ambulatory Visit: Payer: Self-pay | Admitting: Medical

## 2017-06-19 VITALS — BP 133/86 | HR 81 | Temp 97.7°F | Resp 18 | Ht 73.0 in | Wt 223.0 lb

## 2017-06-19 DIAGNOSIS — S00419A Abrasion of unspecified ear, initial encounter: Secondary | ICD-10-CM

## 2017-06-19 MED ORDER — NEOMYCIN-POLYMYXIN-HC 3.5-10000-1 OT SOLN: 4.0000 [drp] | Freq: Three times a day (TID) | OTIC | 0 refills | Status: DC

## 2017-06-19 NOTE — Progress Notes (Signed)
   Subjective:    Patient ID: Francisco Mendez, male    DOB: Dec 29, 1980, 37 y.o.   MRN: [id number]  HPI 37 yo male in non acute distress. Cleaning ears with Q-tip and saw blood on Q-tip on left ear..  Used a particle  mask when he took down a [place] yesterday and was worried about something occurring with his ear due to the blood.  Ate a hot dog for lunch, woke up with some indigestion on Protonix. For dinner fried Chicken.  Review of Systems  Constitutional: Negative for chills and fever.  HENT: Positive for ear discharge (blood on Q-tip). Negative for congestion, postnasal drip and sore throat.   Respiratory: Positive for cough (little bit).   Cardiovascular: Negative for chest pain.  Gastrointestinal: Positive for abdominal pain (woke up with a little indigestion.).   Wears CPAP     Objective:   Physical Exam  Constitutional: He is oriented to person, place, and time. He appears well-developed and well-nourished.  HENT:  Head: Normocephalic and atraumatic.  Right Ear: Hearing, tympanic membrane and external ear normal.  Left Ear: Hearing and external ear normal.  Neurological: He is alert and oriented to person, place, and time.  Skin: Skin is warm and dry.  Psychiatric: He has a normal mood and affect. His behavior is normal. Judgment and thought content normal.  Nursing note and vitals reviewed.   Ear abrasions both base of canal L>R     Assessment & Plan:  Abrasions on both ear canals from Q-tips. Meds ordered this encounter  Medications  . neomycin-polymyxin-hydrocortisone (CORTISPORIN) OTIC solution    Sig: Place 4 drops into both ears 3 (three) times daily. for 7 days    Dispense:  10 mL    Refill:  0  No more Q=tips in ears. Return to the clinic as needed. Return to the clinic if cough occurs. Patient verbalizes understanding and has no questions at discharge.

## 2017-07-07 ENCOUNTER — Other Ambulatory Visit (HOSPITAL_COMMUNITY): Payer: Self-pay | Admitting: *Deleted

## 2017-07-07 MED ORDER — PANTOPRAZOLE SODIUM 40 MG PO TBEC: 40.0000 mg | DELAYED_RELEASE_TABLET | Freq: Every day | ORAL | 0 refills | Status: DC

## 2017-10-03 ENCOUNTER — Other Ambulatory Visit (HOSPITAL_COMMUNITY): Payer: Self-pay | Admitting: Internal Medicine

## 2017-10-07 ENCOUNTER — Other Ambulatory Visit (HOSPITAL_COMMUNITY): Payer: Self-pay | Admitting: Internal Medicine

## 2017-10-11 NOTE — Telephone Encounter (Signed)
This is Dr Bensimhon's pt. Dr. Haroldine Laws prescribed rosuvastatin. Dr. Radford Pax only sees this pt for sleep. Please address

## 2018-01-24 ENCOUNTER — Other Ambulatory Visit (HOSPITAL_COMMUNITY): Payer: Self-pay | Admitting: *Deleted

## 2018-01-24 MED ORDER — PANTOPRAZOLE SODIUM 40 MG PO TBEC: 40.0000 mg | DELAYED_RELEASE_TABLET | Freq: Every day | ORAL | 3 refills | Status: DC

## 2018-01-24 MED ORDER — ROSUVASTATIN CALCIUM 20 MG PO TABS: 20.0000 mg | ORAL_TABLET | Freq: Every day | ORAL | 3 refills | Status: DC

## 2018-07-16 ENCOUNTER — Other Ambulatory Visit: Payer: Self-pay | Admitting: Urology

## 2018-07-16 ENCOUNTER — Encounter: Payer: Self-pay | Admitting: Urology

## 2018-07-16 ENCOUNTER — Ambulatory Visit (INDEPENDENT_AMBULATORY_CARE_PROVIDER_SITE_OTHER): Payer: 59 | Admitting: Urology

## 2018-07-16 VITALS — BP 148/95 | HR 56 | Ht 73.0 in | Wt 228.2 lb

## 2018-07-16 DIAGNOSIS — Z3009 Encounter for other general counseling and advice on contraception: Secondary | ICD-10-CM

## 2018-07-16 MED ORDER — DIAZEPAM 10 MG PO TABS: ORAL_TABLET | ORAL | 0 refills | Status: DC

## 2018-07-16 NOTE — Patient Instructions (Signed)

## 2018-07-16 NOTE — Progress Notes (Signed)
07/16/2018 9:05 AM   Francisco Mendez 1981-02-04 [id number]  Referring provider: Ria Bush, MD [address] [place], [place] [address]  Chief Complaint  Patient presents with  . VAS Consult    HPI: 38 year old male married with 2 children.  He is interested in vasectomy as a means of permanent sterilization.  Past urologic history markable for evaluation for hematuria as a teenager which was negative.  He denies history of epididymitis or chronic scrotal pain.  PMH: Past Medical History:  Diagnosis Date  . Allergic rhinitis   . Basal cell carcinoma 2016  . Hematuria ~2001   normal workup with cystoscopy and CT  . History of chicken pox   . OSA (obstructive sleep apnea) 08/2016   sleep study AHI 22 ([facility])    Surgical History: Past Surgical History:  Procedure Laterality Date  . CYSTOSCOPY  2001   for hematuria, WNL    Home Medications:  Allergies as of 07/16/2018      Reactions   Keflex [cephalexin] Rash   hives      Medication List       Accurate as of July 16, 2018  9:05 AM. Always use your most recent med list.        Krill Oil 500 MG Caps Take 1 capsule by mouth daily.   MULTIVITAMIN ADULT PO Take 1 tablet by mouth daily.   neomycin-polymyxin-hydrocortisone OTIC solution Commonly known as:  CORTISPORIN Place 4 drops into both ears 3 (three) times daily. for 7 days   pantoprazole 40 MG tablet Commonly known as:  PROTONIX Take 1 tablet (40 mg total) by mouth daily.   rosuvastatin 20 MG tablet Commonly known as:  CRESTOR Take 1 tablet (20 mg total) by mouth daily.       Allergies:  Allergies  Allergen Reactions  . Keflex [Cephalexin] Rash    hives    Family History: Family History  Problem Relation Age of Onset  . Alzheimer's disease Paternal Grandfather   . CAD Maternal Grandmother   . CAD Maternal Grandfather   . Cancer Neg Hx   . Diabetes Neg Hx     Social History:  reports that he has never smoked. He has quit  using smokeless tobacco.  His smokeless tobacco use included snuff. He reports current alcohol use. He reports that he does not use drugs.  ROS: UROLOGY Frequent Urination?: Yes Hard to postpone urination?: No Burning/pain with urination?: No Get up at night to urinate?: No Leakage of urine?: No Urine stream starts and stops?: No Trouble starting stream?: No Do you have to strain to urinate?: No Blood in urine?: No Urinary tract infection?: No Sexually transmitted disease?: No Injury to kidneys or bladder?: No Painful intercourse?: No Weak stream?: No Erection problems?: No Penile pain?: No  Gastrointestinal Nausea?: No Vomiting?: No Indigestion/heartburn?: No Diarrhea?: No Constipation?: No  Constitutional Fever: No Night sweats?: No Weight loss?: No Fatigue?: No  Skin Skin rash/lesions?: No Itching?: No  Eyes Blurred vision?: No Double vision?: No  Ears/Nose/Throat Sore throat?: No Sinus problems?: No  Hematologic/Lymphatic Swollen glands?: No Easy bruising?: No  Cardiovascular Leg swelling?: No Chest pain?: No  Respiratory Cough?: No Shortness of breath?: No  Endocrine Excessive thirst?: No  Musculoskeletal Back pain?: No Joint pain?: No  Neurological Headaches?: No Dizziness?: No  Psychologic Depression?: No Anxiety?: No  Physical Exam: BP (!) 148/95 (BP Location: Left Arm, Patient Position: Sitting, Cuff Size: Large)   Pulse (!) 56   Ht  6\' 1"  (1.854 m)   Wt 228 lb 3.2 oz (103.5 kg)   BMI 30.11 kg/m   Constitutional:  Alert and oriented, No acute distress. HEENT: [place] AT, moist mucus membranes.  Trachea midline, no masses. Cardiovascular: No clubbing, cyanosis, or edema. Respiratory: Normal respiratory effort, no increased work of breathing. GI: Abdomen is soft, nontender, nondistended, no abdominal masses GU: No CVA tenderness.  Penis circumcised without lesions, testes descended bilateral without masses or tenderness  cord/epididymis palpably normal bilaterally.  Vasa easily palpable bilaterally. Lymph: No cervical or inguinal lymphadenopathy. Skin: No rashes, bruises or suspicious lesions. Neurologic: Grossly intact, no focal deficits, moving all 4 extremities. Psychiatric: Normal mood and affect.   Assessment & Plan:   38 year old male with undesired fertility who desires vasectomy.  We had a long discussion about vasectomy. We specifically discussed the procedure, recovery and the risks, benefits and alternatives of vasectomy. I explained that the procedure entails removal of a segment of each vas deferens, each of which conducts sperm, and that the purpose of this procedure is to cause sterility (inability to produce children or cause pregnancy). Vasectomy is intended to be permanent and irreversible form of contraception. Options for fertility after vasectomy include vasectomy reversal, or sperm retrieval with in vitro fertilization. These options are not always successful, and they may be expensive. We discussed reversible forms of birth control such as condoms, IUD or diaphragms, as well as the option of freezing sperm in a sperm bank prior to the vasectomy procedure. We discussed the importance of avoiding strenuous exercise for four days after vasectomy, and the importance of refraining from any form of ejaculation for seven days after vasectomy. I explained that vasectomy does not produce immediate sterility so another form of contraceptive must be used until sterility is assured by having semen checked for sperm. Thus, a post vasectomy semen analysis is necessary to confirm sterility. Rarely, vasectomy must be repeated. We discussed the approximately 1 in 2,000 risk of pregnancy after vasectomy for men who have post-vasectomy semen analysis showing absent sperm or rare non-motile sperm. Typical side effects include a small amount of oozing blood, some discomfort and mild swelling in the area of incision.   Vasectomy does not affect sexual performance, function, please, sensation, interest, desire, satisfaction, penile erection, volume of semen or ejaculation. Other rare risks include allergy or adverse reaction to an anesthetic, testicular atrophy, hematoma, infection/abscess, prolonged tenderness of the vas deferens, pain, swelling, painful nodule or scar (called sperm granuloma) or epididymtis. We discussed chronic testicular pain syndrome. This has been reported to occur in as many as 1-2% of men and may be permanent. This can be treated with medication, small procedures or (rarely) surgery.  Rx Valium sent to pharmacy as a preprocedure anxiolytic.  He was informed he will need a driver if utilizing this medication.   Abbie Sons, [place] [address] [place], [place] [address] [phone]

## 2018-08-02 ENCOUNTER — Encounter: Payer: 59 | Admitting: Urology

## 2018-12-25 ENCOUNTER — Other Ambulatory Visit (HOSPITAL_COMMUNITY): Payer: Self-pay | Admitting: *Deleted

## 2018-12-25 MED ORDER — PANTOPRAZOLE SODIUM 40 MG PO TBEC: 40.0000 mg | DELAYED_RELEASE_TABLET | Freq: Every day | ORAL | 3 refills | Status: DC

## 2019-03-25 ENCOUNTER — Other Ambulatory Visit: Payer: Self-pay | Admitting: Family Medicine

## 2019-03-25 DIAGNOSIS — E781 Pure hyperglyceridemia: Secondary | ICD-10-CM

## 2019-03-26 ENCOUNTER — Other Ambulatory Visit: Payer: Self-pay

## 2019-03-26 ENCOUNTER — Other Ambulatory Visit (INDEPENDENT_AMBULATORY_CARE_PROVIDER_SITE_OTHER): Payer: 59

## 2019-03-26 DIAGNOSIS — E781 Pure hyperglyceridemia: Secondary | ICD-10-CM

## 2019-03-26 LAB — COMPREHENSIVE METABOLIC PANEL
ALT: 49 U/L (ref 0–53)
AST: 28 U/L (ref 0–37)
Albumin: 4.8 g/dL (ref 3.5–5.2)
Alkaline Phosphatase: 93 U/L (ref 39–117)
BUN: 16 mg/dL (ref 6–23)
CO2: 30 mEq/L (ref 19–32)
Calcium: 9.4 mg/dL (ref 8.4–10.5)
Chloride: 97 mEq/L (ref 96–112)
Creatinine, Ser: 1.1 mg/dL (ref 0.40–1.50)
GFR: 75.45 mL/min (ref >60.00)
Glucose, Bld: 109 mg/dL — ABNORMAL HIGH (ref 70–99)
Potassium: 3.9 mEq/L (ref 3.5–5.1)
Sodium: 135 mEq/L (ref 135–145)
Total Bilirubin: 0.8 mg/dL (ref 0.2–1.2)
Total Protein: 7.6 g/dL (ref 6.0–8.3)

## 2019-03-26 LAB — LDL CHOLESTEROL, DIRECT: Direct LDL: 80 mg/dL

## 2019-03-26 LAB — LIPID PANEL
Cholesterol: 182 mg/dL (ref 0–200)
HDL: 34.7 mg/dL — ABNORMAL LOW (ref >39.00)
Total CHOL/HDL Ratio: 5
Triglycerides: 414 mg/dL — ABNORMAL HIGH (ref 0.0–149.0)

## 2019-04-01 ENCOUNTER — Encounter: Payer: Self-pay | Admitting: Family Medicine

## 2019-04-01 ENCOUNTER — Other Ambulatory Visit: Payer: Self-pay

## 2019-04-01 ENCOUNTER — Ambulatory Visit (INDEPENDENT_AMBULATORY_CARE_PROVIDER_SITE_OTHER): Payer: 59 | Admitting: Family Medicine

## 2019-04-01 VITALS — BP 120/84 | HR 73 | Temp 98.0°F | Ht 72.0 in | Wt 241.6 lb

## 2019-04-01 DIAGNOSIS — Z23 Encounter for immunization: Secondary | ICD-10-CM

## 2019-04-01 DIAGNOSIS — K219 Gastro-esophageal reflux disease without esophagitis: Secondary | ICD-10-CM

## 2019-04-01 DIAGNOSIS — E781 Pure hyperglyceridemia: Secondary | ICD-10-CM

## 2019-04-01 DIAGNOSIS — E669 Obesity, unspecified: Secondary | ICD-10-CM

## 2019-04-01 DIAGNOSIS — Z0000 Encounter for general adult medical examination without abnormal findings: Secondary | ICD-10-CM | POA: Diagnosis not present

## 2019-04-01 MED ORDER — PANTOPRAZOLE SODIUM 40 MG PO TBEC: 40.0000 mg | DELAYED_RELEASE_TABLET | Freq: Every day | ORAL | 3 refills | Status: DC

## 2019-04-01 MED ORDER — OMEGA-3-ACID ETHYL ESTERS 1 G PO CAPS: 1.0000 g | ORAL_CAPSULE | Freq: Two times a day (BID) | ORAL | 3 refills | Status: DC

## 2019-04-01 MED ORDER — ROSUVASTATIN CALCIUM 20 MG PO TABS: 20.0000 mg | ORAL_TABLET | Freq: Every day | ORAL | 3 refills | Status: DC

## 2019-04-01 NOTE — Progress Notes (Signed)
This visit was conducted in person.  BP 120/84 (BP Location: Left Arm, Patient Position: Sitting, Cuff Size: Large)   Pulse 73   Temp 98 F (36.7 C) (Temporal)   Ht 6' (1.829 m)   Wt 241 lb 9 oz (109.6 kg)   SpO2 98%   BMI 32.76 kg/m    CC: CPE Subjective:    Patient ID: Francisco Mendez, male    DOB: 10/09/1980, 38 y.o.   MRN: [id number]  HPI: Francisco Mendez is a 38 y.o. male presenting on 04/01/2019 for Annual Exam   Has not been taking krill oil - notes joints felt better when taking krill oil.   Preventative: Flu shot - declines Td 2018 Seat belt use discussed. Sunscreen use discussed. No changing moles on skin. h/o BCC removed from face.  Non smoker.  Alcohol - rare.  Dentist q6 mo Eye exam sees regularly  Caffeine: 2 cups soda/day Lives with wife, 1 daughter (2011), 2 dogs Occupation: was Engineer, structural, now at [employer] Edu: HS Activity: jogging and push ups Diet: good water, not much fruits/vegetables, eats meats and potatoes     Relevant past medical, surgical, family and social history reviewed and updated as indicated. Interim medical history since our last visit reviewed. Allergies and medications reviewed and updated. Outpatient Medications Prior to Visit  Medication Sig Dispense Refill  . Krill Oil 500 MG CAPS Take 1 capsule by mouth daily.    . Multiple Vitamins-Minerals (MULTIVITAMIN ADULT PO) Take 1 tablet by mouth daily.    . pantoprazole (PROTONIX) 40 MG tablet Take 1 tablet (40 mg total) by mouth daily. 90 tablet 3  . rosuvastatin (CRESTOR) 20 MG tablet Take 1 tablet (20 mg total) by mouth daily. 90 tablet 3  . diazepam (VALIUM) 10 MG tablet 1 tab po 30 min prior to procedure 1 tablet 0  . neomycin-polymyxin-hydrocortisone (CORTISPORIN) OTIC solution Place 4 drops into both ears 3 (three) times daily. for 7 days 10 mL 0   No facility-administered medications prior to visit.      Per HPI unless specifically indicated in ROS section  below Review of Systems  Constitutional: Negative for activity change, appetite change, chills, fatigue, fever and unexpected weight change.  HENT: Negative for hearing loss.   Eyes: Negative for visual disturbance.  Respiratory: Negative for cough, chest tightness, shortness of breath and wheezing.   Cardiovascular: Negative for chest pain, palpitations and leg swelling.  Gastrointestinal: Negative for abdominal distention, abdominal pain, blood in stool, constipation, diarrhea, nausea and vomiting.  Genitourinary: Negative for difficulty urinating and hematuria.  Musculoskeletal: Negative for arthralgias, myalgias and neck pain.  Skin: Negative for rash.  Neurological: Negative for dizziness, seizures, syncope and headaches.  Hematological: Negative for adenopathy. Does not bruise/bleed easily.  Psychiatric/Behavioral: Negative for dysphoric mood. The patient is not nervous/anxious.    Objective:    BP 120/84 (BP Location: Left Arm, Patient Position: Sitting, Cuff Size: Large)   Pulse 73   Temp 98 F (36.7 C) (Temporal)   Ht 6' (1.829 m)   Wt 241 lb 9 oz (109.6 kg)   SpO2 98%   BMI 32.76 kg/m   Wt Readings from Last 3 Encounters:  04/01/19 241 lb 9 oz (109.6 kg)  07/16/18 228 lb 3.2 oz (103.5 kg)  06/19/17 223 lb (101.2 kg)    Physical Exam Vitals signs and nursing note reviewed.  Constitutional:      General: He is not in acute distress.  Appearance: Normal appearance. He is well-developed. He is obese. He is not ill-appearing.  HENT:     Head: Normocephalic and atraumatic.     Right Ear: Hearing, tympanic membrane, ear canal and external ear normal.     Left Ear: Hearing, tympanic membrane, ear canal and external ear normal.     Nose: Nose normal.     Mouth/Throat:     Mouth: Mucous membranes are moist.     Pharynx: Oropharynx is clear. Uvula midline. No posterior oropharyngeal erythema.  Eyes:     General: No scleral icterus.    Conjunctiva/sclera: Conjunctivae  normal.     Pupils: Pupils are equal, round, and reactive to light.  Neck:     Musculoskeletal: Normal range of motion and neck supple.  Cardiovascular:     Rate and Rhythm: Normal rate and regular rhythm.     Pulses: Normal pulses.          Radial pulses are 2+ on the right side and 2+ on the left side.     Heart sounds: Normal heart sounds. No murmur.  Pulmonary:     Effort: Pulmonary effort is normal. No respiratory distress.     Breath sounds: Normal breath sounds. No wheezing, rhonchi or rales.  Abdominal:     General: Abdomen is flat. Bowel sounds are normal. There is no distension.     Palpations: Abdomen is soft. There is no mass.     Tenderness: There is no abdominal tenderness. There is no guarding or rebound.     Hernia: No hernia is present.  Musculoskeletal: Normal range of motion.     Right lower leg: No edema.     Left lower leg: No edema.  Lymphadenopathy:     Cervical: No cervical adenopathy.  Skin:    General: Skin is warm and dry.     Findings: No rash.  Neurological:     General: No focal deficit present.     Mental Status: He is alert and oriented to person, place, and time.     Comments: CN grossly intact, station and gait intact  Psychiatric:        Mood and Affect: Mood normal.        Behavior: Behavior normal.        Thought Content: Thought content normal.        Judgment: Judgment normal.       Results for orders placed or performed in visit on 03/26/19  Comprehensive metabolic panel  Result Value Ref Range   Sodium 135 135 - 145 mEq/L   Potassium 3.9 3.5 - 5.1 mEq/L   Chloride 97 96 - 112 mEq/L   CO2 30 19 - 32 mEq/L   Glucose, Bld 109 (H) 70 - 99 mg/dL   BUN 16 6 - 23 mg/dL   Creatinine, Ser 1.10 0.40 - 1.50 mg/dL   Total Bilirubin 0.8 0.2 - 1.2 mg/dL   Alkaline Phosphatase 93 39 - 117 U/L   AST 28 0 - 37 U/L   ALT 49 0 - 53 U/L   Total Protein 7.6 6.0 - 8.3 g/dL   Albumin 4.8 3.5 - 5.2 g/dL   GFR 75.45 >60.00 mL/min   Calcium 9.4 8.4  - 10.5 mg/dL  Lipid panel  Result Value Ref Range   Cholesterol 182 0 - 200 mg/dL   Triglycerides (H) 0.0 - 149.0 mg/dL    414.0 Triglyceride is over 400; calculations on Lipids are invalid.   HDL 34.70 (L) >39.00  mg/dL   Total CHOL/HDL Ratio 5   LDL cholesterol, direct  Result Value Ref Range   Direct LDL 80.0 mg/dL   Assessment & Plan:  This visit occurred during the SARS-CoV-2 public health emergency.  Safety protocols were in place, including screening questions prior to the visit, additional usage of staff PPE, and extensive cleaning of exam room while observing appropriate contact time as indicated for disinfecting solutions.   Problem List Items Addressed This Visit    Obesity, Class I, BMI 30-34.9    Weight gain noted. Encouraged healthy diet and lifestyle changes to affect sustainable weight loss.       Healthcare maintenance - Primary    Preventative protocols reviewed and updated unless pt declined. Discussed healthy diet and lifestyle.       GERD (gastroesophageal reflux disease)    Stable period on daily PPI. Discussed risks of long term PPI use. He will try QOD dosing.       Relevant Medications   pantoprazole (PROTONIX) 40 MG tablet   Familial hypertriglyceridemia    Chronic, compliant with crestor 20mg  nightly. Trig markedly elevated - possibly due to greasy meal the night before although he was fully fasting for recent labwork. Will price out lovaza, if expensive to start krill oil. Consider fibrate.  The ASCVD Risk score Mikey Bussing DC Jr., et al., 2013) failed to calculate for the following reasons:   The 2013 ASCVD risk score is only valid for ages 37 to 55       Relevant Medications   rosuvastatin (CRESTOR) 20 MG tablet   omega-3 acid ethyl esters (LOVAZA) 1 g capsule    Other Visit Diagnoses    Need for influenza vaccination       Relevant Orders   Flu Vaccine QUAD 36+ mos IM (Completed)       Meds ordered this encounter  Medications  . pantoprazole  (PROTONIX) 40 MG tablet    Sig: Take 1 tablet (40 mg total) by mouth daily.    Dispense:  90 tablet    Refill:  3  . rosuvastatin (CRESTOR) 20 MG tablet    Sig: Take 1 tablet (20 mg total) by mouth daily.    Dispense:  90 tablet    Refill:  3  . omega-3 acid ethyl esters (LOVAZA) 1 g capsule    Sig: Take 1 capsule (1 g total) by mouth 2 (two) times daily.    Dispense:  180 capsule    Refill:  3    To price out in place of krill oil   Orders Placed This Encounter  Procedures  . Flu Vaccine QUAD 36+ mos IM    Patient instructions: Flu shot today Schedule skin check at your convenience.  Price out lovaza prescription omega 3 fatty acids and if unaffordable then just restart krill oil.  Decrease added sugars, eliminate trans fats, increase fiber and limit alcohol. Increase fatty fish (salmon, tuna, trout, etc) in the diet - these fish are rich in omega three fatty acids. All these changes together can drop triglycerides by almost 50%.  Good to see you today. Return as needed or in 1 year for next physical.  Schedule fasting lab visit only on 3 months to recheck cholesterol levels and sugar.   Follow up plan: Return in about 1 year (around 03/31/2020) for annual exam, prior fasting for blood work.  Ria Bush, MD

## 2019-04-01 NOTE — Assessment & Plan Note (Signed)
Preventative protocols reviewed and updated unless pt declined. Discussed healthy diet and lifestyle.  

## 2019-04-01 NOTE — Assessment & Plan Note (Signed)
Weight gain noted. Encouraged healthy diet and lifestyle changes to affect sustainable weight loss.  

## 2019-04-01 NOTE — Assessment & Plan Note (Addendum)
Chronic, compliant with crestor 20mg  nightly. Trig markedly elevated - possibly due to greasy meal the night before although he was fully fasting for recent labwork. Will price out lovaza, if expensive to start krill oil. Consider fibrate.  The ASCVD Risk score Mikey Bussing DC Jr., et al., 2013) failed to calculate for the following reasons:   The 2013 ASCVD risk score is only valid for ages 96 to 64

## 2019-04-01 NOTE — Patient Instructions (Addendum)
Flu shot today Schedule skin check at your convenience.  Price out lovaza prescription omega 3 fatty acids and if unaffordable then just restart krill oil.  Decrease added sugars, eliminate trans fats, increase fiber and limit alcohol. Increase fatty fish (salmon, tuna, trout, etc) in the diet - these fish are rich in omega three fatty acids. All these changes together can drop triglycerides by almost 50%.  Good to see you today. Return as needed or in 1 year for next physical.  Schedule fasting lab visit only on 3 months to recheck cholesterol levels and sugar.   Health Maintenance, Male Adopting a healthy lifestyle and getting preventive care are important in promoting health and wellness. Ask your health care provider about:  The right schedule for you to have regular tests and exams.  Things you can do on your own to prevent diseases and keep yourself healthy. What should I know about diet, weight, and exercise? Eat a healthy diet   Eat a diet that includes plenty of vegetables, fruits, low-fat dairy products, and lean protein.  Do not eat a lot of foods that are high in solid fats, added sugars, or sodium. Maintain a healthy weight Body mass index (BMI) is a measurement that can be used to identify possible weight problems. It estimates body fat based on height and weight. Your health care provider can help determine your BMI and help you achieve or maintain a healthy weight. Get regular exercise Get regular exercise. This is one of the most important things you can do for your health. Most adults should:  Exercise for at least 150 minutes each week. The exercise should increase your heart rate and make you sweat (moderate-intensity exercise).  Do strengthening exercises at least twice a week. This is in addition to the moderate-intensity exercise.  Spend less time sitting. Even light physical activity can be beneficial. Watch cholesterol and blood lipids Have your blood tested  for lipids and cholesterol at 36 years of age, then have this test every 5 years. You may need to have your cholesterol levels checked more often if:  Your lipid or cholesterol levels are high.  You are older than 36 years of age.  You are at high risk for heart disease. What should I know about cancer screening? Many types of cancers can be detected early and may often be prevented. Depending on your health history and family history, you may need to have cancer screening at various ages. This may include screening for:  Colorectal cancer.  Prostate cancer.  Skin cancer.  Lung cancer. What should I know about heart disease, diabetes, and high blood pressure? Blood pressure and heart disease  High blood pressure causes heart disease and increases the risk of stroke. This is more likely to develop in people who have high blood pressure readings, are of African descent, or are overweight.  Talk with your health care provider about your target blood pressure readings.  Have your blood pressure checked: ? Every 3-5 years if you are 57-50 years of age. ? Every year if you are 63 years old or older.  If you are between the ages of 75 and 66 and are a current or former smoker, ask your health care provider if you should have a one-time screening for abdominal aortic aneurysm (AAA). Diabetes Have regular diabetes screenings. This checks your fasting blood sugar level. Have the screening done:  Once every three years after age 45 if you are at a normal weight and have  a low risk for diabetes.  More often and at a younger age if you are overweight or have a high risk for diabetes. What should I know about preventing infection? Hepatitis B If you have a higher risk for hepatitis B, you should be screened for this virus. Talk with your health care provider to find out if you are at risk for hepatitis B infection. Hepatitis C Blood testing is recommended for:  Everyone born from [birth year]  through 1965.  Anyone with known risk factors for hepatitis C. Sexually transmitted infections (STIs)  You should be screened each year for STIs, including gonorrhea and chlamydia, if: ? You are sexually active and are younger than 36 years of age. ? You are older than 36 years of age and your health care provider tells you that you are at risk for this type of infection. ? Your sexual activity has changed since you were last screened, and you are at increased risk for chlamydia or gonorrhea. Ask your health care provider if you are at risk.  Ask your health care provider about whether you are at high risk for HIV. Your health care provider may recommend a prescription medicine to help prevent HIV infection. If you choose to take medicine to prevent HIV, you should first get tested for HIV. You should then be tested every 3 months for as long as you are taking the medicine. Follow these instructions at home: Lifestyle  Do not use any products that contain nicotine or tobacco, such as cigarettes, e-cigarettes, and chewing tobacco. If you need help quitting, ask your health care provider.  Do not use street drugs.  Do not share needles.  Ask your health care provider for help if you need support or information about quitting drugs. Alcohol use  Do not drink alcohol if your health care provider tells you not to drink.  If you drink alcohol: ? Limit how much you have to 0-2 drinks a day. ? Be aware of how much alcohol is in your drink. In the [place] one drink equals one 12 oz bottle of beer (355 mL), one 5 oz glass of wine (148 mL), or one 1 oz glass of hard liquor (44 mL). General instructions  Schedule regular health, dental, and eye exams.  Stay current with your vaccines.  Tell your health care provider if: ? You often feel depressed. ? You have ever been abused or do not feel safe at home. Summary  Adopting a healthy lifestyle and getting preventive care are important in  promoting health and wellness.  Follow your health care provider's instructions about healthy diet, exercising, and getting tested or screened for diseases.  Follow your health care provider's instructions on monitoring your cholesterol and blood pressure. This information is not intended to replace advice given to you by your health care provider. Make sure you discuss any questions you have with your health care provider. Document Released: 10/22/2007 Document Revised: 04/18/2018 Document Reviewed: 04/18/2018 Elsevier Patient Education  2020 [employer].

## 2019-04-01 NOTE — Assessment & Plan Note (Signed)
Stable period on daily PPI. Discussed risks of long term PPI use. He will try QOD dosing.

## 2019-04-24 NOTE — Progress Notes (Deleted)
Virtual Visit via Video Note   This visit type was conducted due to national recommendations for restrictions regarding the COVID-19 Pandemic (e.g. social distancing) in an effort to limit this patient's exposure and mitigate transmission in our community.  Due to his co-morbid illnesses, this patient is at least at moderate risk for complications without adequate follow up.  This format is felt to be most appropriate for this patient at this time.  All issues noted in this document were discussed and addressed.  A limited physical exam was performed with this format.  Please refer to the patient's chart for his consent to telehealth for [facility].  Evaluation Performed:  Follow-up visit  This visit type was conducted due to national recommendations for restrictions regarding the COVID-19 Pandemic (e.g. social distancing).  This format is felt to be most appropriate for this patient at this time.  All issues noted in this document were discussed and addressed.  No physical exam was performed (except for noted visual exam findings with Video Visits).  Please refer to the patient's chart (MyChart message for video visits and phone note for telephone visits) for the patient's consent to telehealth for [facility].  Date:  04/24/2019   ID:  Francisco Mendez, DOB July 27, 1980, MRN [id number]  Patient Location:  Home  Provider location:   [place]  PCP:  Ria Bush, MD  Cardiologist:  Glori Bickers, MD Sleep Medicine:  Fransico Him, MD Electrophysiologist:  None   Chief Complaint:  OSA  History of Present Illness:    Francisco Mendez is a 38 y.o. male who presents via audio/video conferencing for a telehealth visit today.    Francisco Mendez is a 38 y.o. male with a hx of of OSA who is referred today by Dr. Haroldine Laws for evaluation of his sleep apnea.  He recently underwent PSG for excessive daytime sleepiness, snoring and witnessed apneic events and an Epworth sleepiness score of  10.  PSG showed moderate OSA with an AHI of 22/hr with oxygen desaturations as low as 86%.  He underwent CPAP titration to 10cm H2O.   He is doing well with his CPAP device.  He tolerates the mask and feels the pressure is adequate.  Since going on CPAP he feels rested in the am and has no significant daytime sleepiness.  He denies any significant mouth or nasal dryness or nasal congestion.  He does not think that he snores.    The patient does not have symptoms concerning for COVID-19 infection (fever, chills, cough, or new shortness of breath).    Prior CV studies:   The following studies were reviewed today:  PAP compliance download  Past Medical History:  Diagnosis Date  . Allergic rhinitis   . Basal cell carcinoma 2016  . Hematuria ~2001   normal workup with cystoscopy and CT  . History of basal cell carcinoma 11/17/2014   left mid nasolabial  . History of chicken pox   . History of dysplastic nevus    left posterior shoulder, right post shoulder  . OSA (obstructive sleep apnea) 08/2016   sleep study AHI 22 (Raffaele Derise)   Past Surgical History:  Procedure Laterality Date  . CYSTOSCOPY  2001   for hematuria, WNL     No outpatient medications have been marked as taking for the 04/25/19 encounter (Appointment) with Sueanne Margarita, MD.     Allergies:   Keflex [cephalexin]   Social History   Tobacco Use  . Smoking status: Never Smoker  .  Smokeless tobacco: Former Systems developer    Types: Snuff  Substance Use Topics  . Alcohol use: Yes    Comment: Occasional  . Drug use: No     Family Hx: The patient's family history includes Alzheimer's disease in his paternal grandfather; CAD in his maternal grandfather and maternal grandmother. There is no history of Cancer or Diabetes.  ROS:   Please see the history of present illness.     All other systems reviewed and are negative.   Labs/Other Tests and Data Reviewed:    Recent Labs: 03/26/2019: ALT 49; BUN 16; Creatinine, Ser  1.10; Potassium 3.9; Sodium 135   Recent Lipid Panel Lab Results  Component Value Date/Time   CHOL 182 03/26/2019 07:51 AM   CHOL 259 (H) 07/25/2016 09:19 AM   TRIG (H) 03/26/2019 07:51 AM    414.0 Triglyceride is over 400; calculations on Lipids are invalid.   HDL 34.70 (L) 03/26/2019 07:51 AM   HDL 30 (L) 07/25/2016 09:19 AM   CHOLHDL 5 03/26/2019 07:51 AM   LDLCALC 65 03/16/2017 09:31 AM   LDLCALC Comment 07/25/2016 09:19 AM   LDLDIRECT 80.0 03/26/2019 07:51 AM    Wt Readings from Last 3 Encounters:  04/01/19 241 lb 9 oz (109.6 kg)  07/16/18 228 lb 3.2 oz (103.5 kg)  06/19/17 223 lb (101.2 kg)     Objective:    Vital Signs:  There were no vitals taken for this visit.   CONSTITUTIONAL:  Well nourished, well developed male in no acute distress.  EYES: anicteric MOUTH: oral mucosa is pink RESPIRATORY: Normal respiratory effort, symmetric expansion CARDIOVASCULAR: No peripheral edema SKIN: No rash, lesions or ulcers MUSCULOSKELETAL: no digital cyanosis NEURO: Cranial Nerves II-XII grossly intact, moves all extremities PSYCH: Intact judgement and insight.  A&O x 3, Mood/affect appropriate   ASSESSMENT & PLAN:    1.  OSA -  The patient is tolerating PAP therapy well without any problems. The PAP download was reviewed today and showed an AHI of 2.6/hr on 10 cm H2O with 53% compliance in using more than 4 hours nightly.  The patient has been using and benefiting from PAP use and will continue to benefit from therapy.   2.  Obesity -I have encouraged him to get into a routine exercise program and cut back on carbs and portions.    COVID-19 Education: The signs and symptoms of COVID-19 were discussed with the patient and how to seek care for testing (follow up with PCP or arrange E-visit).  The importance of social distancing was discussed today.  Patient Risk:   After full review of this patient's clinical status, I feel that they are at least moderate risk at this  time.  Time:   Today, I have spent 20 minutes directly with the patient on telemedicine discussing medical problems including OSA, Obesity.  We also reviewed the symptoms of COVID 19 and the ways to protect against contracting the virus with telehealth technology.  I spent an additional 5 minutes reviewing patient's chart including PAP compliance download.  Medication Adjustments/Labs and Tests Ordered: Current medicines are reviewed at length with the patient today.  Concerns regarding medicines are outlined above.  Tests Ordered: No orders of the defined types were placed in this encounter.  Medication Changes: No orders of the defined types were placed in this encounter.   Disposition:  Follow up in 1 year(s)  Signed, Fransico Him, MD  04/24/2019 9:38 PM    [place]

## 2019-04-25 ENCOUNTER — Telehealth: Payer: Self-pay | Admitting: *Deleted

## 2019-04-25 ENCOUNTER — Other Ambulatory Visit: Payer: Self-pay

## 2019-04-25 ENCOUNTER — Telehealth: Payer: 59 | Admitting: Cardiology

## 2019-04-25 NOTE — Telephone Encounter (Signed)
Pt returned call and spoke with scheduling team. He thought appointment was tomorrow.  Appointment rescheduled for January 4,2021 at 8:20

## 2019-04-25 NOTE — Telephone Encounter (Signed)
Pt had telemedicine visit scheduled today.  I tried 3 times to reach him but all calls went to voicemail. Left message to call office.

## 2019-05-06 ENCOUNTER — Telehealth: Payer: Self-pay | Admitting: *Deleted

## 2019-05-06 NOTE — Telephone Encounter (Signed)

## 2019-05-12 NOTE — Progress Notes (Signed)
Virtual Visit via Telephone Note   This visit type was conducted due to national recommendations for restrictions regarding the COVID-19 Pandemic (e.g. social distancing) in an effort to limit this patient's exposure and mitigate transmission in our community.  Due to his co-morbid illnesses, this patient is at least at moderate risk for complications without adequate follow up.  This format is felt to be most appropriate for this patient at this time.  All issues noted in this document were discussed and addressed.  A limited physical exam was performed with this format.  Please refer to the patient's chart for his consent to telehealth for [facility].   Evaluation Performed:  Follow-up visit  This visit type was conducted due to national recommendations for restrictions regarding the COVID-19 Pandemic (e.g. social distancing).  This format is felt to be most appropriate for this patient at this time.  All issues noted in this document were discussed and addressed.  No physical exam was performed (except for noted visual exam findings with Video Visits).  Please refer to the patient's chart (MyChart message for video visits and phone note for telephone visits) for the patient's consent to telehealth for [facility].  Date:  05/13/2019   ID:  Francisco Mendez, DOB 11-Apr-1981, MRN [id number]  Patient Location:  Home  Provider location:   [place]  PCP:  Ria Bush, MD  Cardiologist: Glori Bickers, MD Sleep Medicine:  Fransico Him, MD Electrophysiologist:  None   Chief Complaint:  OSA  History of Present Illness:    Francisco Mendez is a 39 y.o. male who presents via audio/video conferencing for a telehealth visit today.    Francisco Mendez is a 39 y.o. male with a hx of of OSA who is referred today by Dr. Haroldine Laws for evaluation of his sleep apnea.  He recently underwent PSG for excessive daytime sleepiness, snoring and witnessed apneic events and an Epworth sleepiness score of  10.  PSG showed moderate OSA with an AHI of 22/hr with oxygen desaturations as low as 86%.  He underwent CPAP titration to 10cm H2O.   He is doing well with his CPAP device and thinks that he has gotten used to it.  He tolerates the nasal pillow mask and feels the pressure is adequate.  Since going on CPAP he feels rested in the am and has no significant daytime sleepiness.  He denies any significant mouth or nasal dryness or nasal congestion.  He does not think that he snores.    The patient does not have symptoms concerning for COVID-19 infection (fever, chills, cough, or new shortness of breath).    Prior CV studies:   The following studies were reviewed today:  PAP compliance download  Past Medical History:  Diagnosis Date  . Allergic rhinitis   . Basal cell carcinoma 2016  . Hematuria ~2001   normal workup with cystoscopy and CT  . History of basal cell carcinoma 11/17/2014   left mid nasolabial  . History of chicken pox   . History of dysplastic nevus    left posterior shoulder, right post shoulder  . OSA (obstructive sleep apnea) 08/2016   sleep study AHI 22 (Trisha Morandi)   Past Surgical History:  Procedure Laterality Date  . CYSTOSCOPY  2001   for hematuria, WNL     Current Meds  Medication Sig  . Multiple Vitamins-Minerals (MULTIVITAMIN ADULT PO) Take 1 tablet by mouth daily.  Marland Kitchen omega-3 acid ethyl esters (LOVAZA) 1 g capsule Take 1  capsule (1 g total) by mouth 2 (two) times daily.  . rosuvastatin (CRESTOR) 20 MG tablet Take 1 tablet (20 mg total) by mouth daily.     Allergies:   Keflex [cephalexin]   Social History   Tobacco Use  . Smoking status: Never Smoker  . Smokeless tobacco: Former Systems developer    Types: Snuff  Substance Use Topics  . Alcohol use: Yes    Comment: Occasional  . Drug use: No     Family Hx: The patient's family history includes Alzheimer's disease in his paternal grandfather; CAD in his maternal grandfather and maternal grandmother. There is no  history of Cancer or Diabetes.  ROS:   Please see the history of present illness.     All other systems reviewed and are negative.   Labs/Other Tests and Data Reviewed:    Recent Labs: 03/26/2019: ALT 49; BUN 16; Creatinine, Ser 1.10; Potassium 3.9; Sodium 135   Recent Lipid Panel Lab Results  Component Value Date/Time   CHOL 182 03/26/2019 07:51 AM   CHOL 259 (H) 07/25/2016 09:19 AM   TRIG (H) 03/26/2019 07:51 AM    414.0 Triglyceride is over 400; calculations on Lipids are invalid.   HDL 34.70 (L) 03/26/2019 07:51 AM   HDL 30 (L) 07/25/2016 09:19 AM   CHOLHDL 5 03/26/2019 07:51 AM   LDLCALC 65 03/16/2017 09:31 AM   LDLCALC Comment 07/25/2016 09:19 AM   LDLDIRECT 80.0 03/26/2019 07:51 AM    Wt Readings from Last 3 Encounters:  05/13/19 230 lb (104.3 kg)  04/01/19 241 lb 9 oz (109.6 kg)  07/16/18 228 lb 3.2 oz (103.5 kg)     Objective:    Vital Signs:  Ht 6\' 1"  (1.854 m)   Wt 230 lb (104.3 kg)   BMI 30.34 kg/m     ASSESSMENT & PLAN:    1.  OSA - The patient is tolerating PAP therapy well without any problems. The PAP download was reviewed today and showed an AHI of 2.3/hr on 10 cm H2O with 67% compliance in using more than 4 hours nightly.  The patient has been using and benefiting from PAP use and will continue to benefit from therapy.   2.  Obesity I have encouraged him to get into a routine exercise program and cut back on carbs and portions.    COVID-19 Education: The signs and symptoms of COVID-19 were discussed with the patient and how to seek care for testing (follow up with PCP or arrange E-visit).  The importance of social distancing was discussed today.  Patient Risk:   After full review of this patient's clinical status, I feel that they are at least moderate risk at this time.  Time:   Today, I have spent 2- minutes  on telemedicine discussing medical problems including OSA, obesity.  We also reviewed the symptoms of COVID 19 and the ways to  protect against contracting the virus with telehealth technology.  I spent an additional 5 minutes reviewing patient's chart including PAP compliance download.  Medication Adjustments/Labs and Tests Ordered: Current medicines are reviewed at length with the patient today.  Concerns regarding medicines are outlined above.  Tests Ordered: No orders of the defined types were placed in this encounter.  Medication Changes: No orders of the defined types were placed in this encounter.   Disposition:  Follow up in 1 year(s)  Signed, Fransico Him, MD  05/13/2019 8:37 AM    [facility]

## 2019-05-13 ENCOUNTER — Telehealth (INDEPENDENT_AMBULATORY_CARE_PROVIDER_SITE_OTHER): Payer: 59 | Admitting: Cardiology

## 2019-05-13 ENCOUNTER — Other Ambulatory Visit: Payer: Self-pay

## 2019-05-13 ENCOUNTER — Encounter: Payer: Self-pay | Admitting: Cardiology

## 2019-05-13 VITALS — Ht 73.0 in | Wt 230.0 lb

## 2019-05-13 DIAGNOSIS — O99211 Obesity complicating pregnancy, first trimester: Secondary | ICD-10-CM

## 2019-05-13 DIAGNOSIS — E6609 Other obesity due to excess calories: Secondary | ICD-10-CM | POA: Diagnosis not present

## 2019-05-13 DIAGNOSIS — G4733 Obstructive sleep apnea (adult) (pediatric): Secondary | ICD-10-CM

## 2019-05-13 NOTE — Patient Instructions (Signed)

## 2019-05-14 ENCOUNTER — Telehealth: Payer: Self-pay | Admitting: *Deleted

## 2019-05-14 DIAGNOSIS — G4733 Obstructive sleep apnea (adult) (pediatric): Secondary | ICD-10-CM

## 2019-05-14 NOTE — Telephone Encounter (Signed)
-----   Message from Sueanne Margarita, MD sent at 05/13/2019  8:38 AM EST ----- Please order new PAP supplies

## 2019-05-14 NOTE — Telephone Encounter (Signed)
Order placed to [facility] via community message. 

## 2019-06-28 ENCOUNTER — Telehealth: Payer: Self-pay

## 2019-06-28 NOTE — Telephone Encounter (Signed)
LVM w COVID screen and back lab info 2.19.2021 TLJ

## 2019-06-30 ENCOUNTER — Other Ambulatory Visit: Payer: Self-pay | Admitting: Family Medicine

## 2019-06-30 DIAGNOSIS — E781 Pure hyperglyceridemia: Secondary | ICD-10-CM

## 2019-07-02 ENCOUNTER — Other Ambulatory Visit (INDEPENDENT_AMBULATORY_CARE_PROVIDER_SITE_OTHER): Payer: 59

## 2019-07-02 ENCOUNTER — Other Ambulatory Visit: Payer: Self-pay

## 2019-07-02 DIAGNOSIS — E781 Pure hyperglyceridemia: Secondary | ICD-10-CM

## 2019-07-02 LAB — COMPREHENSIVE METABOLIC PANEL
ALT: 45 U/L (ref 0–53)
AST: 27 U/L (ref 0–37)
Albumin: 4.7 g/dL (ref 3.5–5.2)
Alkaline Phosphatase: 99 U/L (ref 39–117)
BUN: 18 mg/dL (ref 6–23)
CO2: 29 mEq/L (ref 19–32)
Calcium: 9.4 mg/dL (ref 8.4–10.5)
Chloride: 100 mEq/L (ref 96–112)
Creatinine, Ser: 1.06 mg/dL (ref 0.40–1.50)
GFR: 78.63 mL/min (ref >60.00)
Glucose, Bld: 113 mg/dL — ABNORMAL HIGH (ref 70–99)
Potassium: 4.3 mEq/L (ref 3.5–5.1)
Sodium: 138 mEq/L (ref 135–145)
Total Bilirubin: 0.7 mg/dL (ref 0.2–1.2)
Total Protein: 7.4 g/dL (ref 6.0–8.3)

## 2019-07-02 LAB — LIPID PANEL
Cholesterol: 178 mg/dL (ref 0–200)
HDL: 32.3 mg/dL — ABNORMAL LOW (ref >39.00)
NonHDL: 145.47
Total CHOL/HDL Ratio: 6
Triglycerides: 326 mg/dL — ABNORMAL HIGH (ref 0.0–149.0)
VLDL: 65.2 mg/dL — ABNORMAL HIGH (ref 0.0–40.0)

## 2019-07-02 LAB — LDL CHOLESTEROL, DIRECT: Direct LDL: 87 mg/dL

## 2019-07-02 LAB — TSH: TSH: 2.16 u[IU]/mL (ref 0.35–4.50)

## 2020-03-22 ENCOUNTER — Other Ambulatory Visit: Payer: Self-pay | Admitting: Family Medicine

## 2020-03-22 DIAGNOSIS — E781 Pure hyperglyceridemia: Secondary | ICD-10-CM

## 2020-03-25 ENCOUNTER — Other Ambulatory Visit: Payer: Self-pay

## 2020-03-25 ENCOUNTER — Other Ambulatory Visit: Payer: 59

## 2020-03-26 ENCOUNTER — Other Ambulatory Visit (INDEPENDENT_AMBULATORY_CARE_PROVIDER_SITE_OTHER): Payer: 59

## 2020-03-26 ENCOUNTER — Other Ambulatory Visit: Payer: Self-pay

## 2020-03-26 DIAGNOSIS — E781 Pure hyperglyceridemia: Secondary | ICD-10-CM

## 2020-03-26 LAB — COMPREHENSIVE METABOLIC PANEL
ALT: 46 U/L (ref 0–53)
AST: 31 U/L (ref 0–37)
Albumin: 4.9 g/dL (ref 3.5–5.2)
Alkaline Phosphatase: 89 U/L (ref 39–117)
BUN: 19 mg/dL (ref 6–23)
CO2: 30 mEq/L (ref 19–32)
Calcium: 9.9 mg/dL (ref 8.4–10.5)
Chloride: 101 mEq/L (ref 96–112)
Creatinine, Ser: 1.12 mg/dL (ref 0.40–1.50)
GFR: 83.81 mL/min (ref >60.00)
Glucose, Bld: 106 mg/dL — ABNORMAL HIGH (ref 70–99)
Potassium: 4.2 mEq/L (ref 3.5–5.1)
Sodium: 140 mEq/L (ref 135–145)
Total Bilirubin: 0.6 mg/dL (ref 0.2–1.2)
Total Protein: 7.7 g/dL (ref 6.0–8.3)

## 2020-03-26 LAB — LIPID PANEL
Cholesterol: 173 mg/dL (ref 0–200)
HDL: 36.3 mg/dL — ABNORMAL LOW (ref >39.00)
NonHDL: 136.95
Total CHOL/HDL Ratio: 5
Triglycerides: 322 mg/dL — ABNORMAL HIGH (ref 0.0–149.0)
VLDL: 64.4 mg/dL — ABNORMAL HIGH (ref 0.0–40.0)

## 2020-03-26 LAB — TSH: TSH: 1.99 u[IU]/mL (ref 0.35–4.50)

## 2020-03-26 LAB — LDL CHOLESTEROL, DIRECT: Direct LDL: 83 mg/dL

## 2020-04-01 ENCOUNTER — Ambulatory Visit (INDEPENDENT_AMBULATORY_CARE_PROVIDER_SITE_OTHER): Payer: 59 | Admitting: Family Medicine

## 2020-04-01 ENCOUNTER — Other Ambulatory Visit: Payer: Self-pay

## 2020-04-01 ENCOUNTER — Encounter: Payer: Self-pay | Admitting: Family Medicine

## 2020-04-01 VITALS — BP 124/82 | HR 88 | Temp 97.8°F | Ht 71.75 in | Wt 233.4 lb

## 2020-04-01 DIAGNOSIS — K219 Gastro-esophageal reflux disease without esophagitis: Secondary | ICD-10-CM

## 2020-04-01 DIAGNOSIS — E669 Obesity, unspecified: Secondary | ICD-10-CM | POA: Diagnosis not present

## 2020-04-01 DIAGNOSIS — E785 Hyperlipidemia, unspecified: Secondary | ICD-10-CM

## 2020-04-01 DIAGNOSIS — G4733 Obstructive sleep apnea (adult) (pediatric): Secondary | ICD-10-CM | POA: Diagnosis not present

## 2020-04-01 DIAGNOSIS — Z0000 Encounter for general adult medical examination without abnormal findings: Secondary | ICD-10-CM

## 2020-04-01 MED ORDER — PANTOPRAZOLE SODIUM 40 MG PO TBEC
40.0000 mg | DELAYED_RELEASE_TABLET | ORAL | 3 refills | Status: DC
Start: 2020-04-01 — End: 2020-05-11

## 2020-04-01 MED ORDER — ROSUVASTATIN CALCIUM 20 MG PO TABS
20.0000 mg | ORAL_TABLET | Freq: Every day | ORAL | 3 refills | Status: DC
Start: 2020-04-01 — End: 2021-04-12

## 2020-04-01 MED ORDER — OMEGA-3-ACID ETHYL ESTERS 1 G PO CAPS
1.0000 g | ORAL_CAPSULE | Freq: Two times a day (BID) | ORAL | 3 refills | Status: DC
Start: 2020-04-01 — End: 2021-04-12

## 2020-04-01 NOTE — Assessment & Plan Note (Signed)
Stable period on QOD PPI.  No red flags.

## 2020-04-01 NOTE — Assessment & Plan Note (Signed)
Chronic, on crestor 20mg  daily. Triglycerides elevated despite lovaza 1 capsule daily - will increase to BID and recheck levels in 6 months.  The ASCVD Risk score Mikey Bussing DC Jr., et al., 2013) failed to calculate for the following reasons:   The 2013 ASCVD risk score is only valid for ages 24 to 37

## 2020-04-01 NOTE — Assessment & Plan Note (Signed)
Followed by cards, on CPAP daily.

## 2020-04-01 NOTE — Progress Notes (Signed)
Patient ID: Francisco Mendez, male    DOB: 13-Apr-1981, 38 y.o.   MRN: [id number]  This visit was conducted in person.  BP 124/82 (BP Location: Left Arm, Patient Position: Sitting, Cuff Size: Large)   Pulse 88   Temp 97.8 F (36.6 C) (Temporal)   Ht 5' 11.75" (1.822 m)   Wt 233 lb 6 oz (105.9 kg)   SpO2 95%   BMI 31.87 kg/m    CC: CPE Subjective:   HPI: Francisco Mendez is a 39 y.o. male presenting on 04/01/2020 for Annual Exam   GERD managed with PPI QOD. On this med for 4-5 years. No dysphagia, early satiety, nausea/vomiting.   OSA on CPAP nightly followed by cardiology.   Preventative: Flu shot - declines Td 2018 COVID vaccines - declines - encouraged he get this Seat belt use discussed  Sunscreen use discussed.No changing moles on skin. H/o BCC removed from face.  Non smoker. Occasional cigar. No smokeless tobacco  Alcohol - 1-2 beers a few times a week Dentist q6 mo Eye exam sees regularly  Caffeine: 2 cups soda/day Lives with wife, 1 daughter (2011), 2 dogs Occupation: was Engineer, structural, now at [employer]  Edu: HS Activity: jogging and push ups 3x/wk Diet: good water, improving veggies, eats meats and potatoes     Relevant past medical, surgical, family and social history reviewed and updated as indicated. Interim medical history since our last visit reviewed. Allergies and medications reviewed and updated. Outpatient Medications Prior to Visit  Medication Sig Dispense Refill  . Multiple Vitamins-Minerals (MULTIVITAMIN ADULT PO) Take 1 tablet by mouth daily.    Marland Kitchen omega-3 acid ethyl esters (LOVAZA) 1 g capsule Take 1 capsule (1 g total) by mouth 2 (two) times daily. 180 capsule 3  . pantoprazole (PROTONIX) 40 MG tablet Take 1 tablet (40 mg total) by mouth daily. (Patient taking differently: Take 40 mg by mouth every other day. ) 90 tablet 3  . rosuvastatin (CRESTOR) 20 MG tablet Take 1 tablet (20 mg total) by mouth daily. 90 tablet 3  . Krill Oil 500 MG  CAPS Take 1 capsule by mouth daily.     No facility-administered medications prior to visit.     Per HPI unless specifically indicated in ROS section below Review of Systems  Constitutional: Negative for activity change, appetite change, chills, fatigue, fever and unexpected weight change.  HENT: Positive for sinus pressure. Negative for hearing loss.   Eyes: Negative for visual disturbance.  Respiratory: Negative for cough, chest tightness, shortness of breath and wheezing.   Cardiovascular: Negative for chest pain, palpitations and leg swelling.  Gastrointestinal: Negative for abdominal distention, abdominal pain, blood in stool, constipation, diarrhea, nausea and vomiting.  Genitourinary: Negative for difficulty urinating and hematuria.  Musculoskeletal: Negative for arthralgias, myalgias and neck pain.  Skin: Negative for rash.  Neurological: Negative for dizziness, seizures, syncope and headaches.  Hematological: Negative for adenopathy. Does not bruise/bleed easily.  Psychiatric/Behavioral: Negative for dysphoric mood. The patient is not nervous/anxious.    Objective:  BP 124/82 (BP Location: Left Arm, Patient Position: Sitting, Cuff Size: Large)   Pulse 88   Temp 97.8 F (36.6 C) (Temporal)   Ht 5' 11.75" (1.822 m)   Wt 233 lb 6 oz (105.9 kg)   SpO2 95%   BMI 31.87 kg/m   Wt Readings from Last 3 Encounters:  04/01/20 233 lb 6 oz (105.9 kg)  05/13/19 230 lb (104.3 kg)  04/01/19 241 lb 9  oz (109.6 kg)      Physical Exam Vitals and nursing note reviewed.  Constitutional:      General: He is not in acute distress.    Appearance: Normal appearance. He is well-developed. He is not ill-appearing.  HENT:     Head: Normocephalic and atraumatic.     Right Ear: Hearing, tympanic membrane, ear canal and external ear normal.     Left Ear: Hearing, tympanic membrane, ear canal and external ear normal.  Eyes:     General: No scleral icterus.    Extraocular Movements:  Extraocular movements intact.     Conjunctiva/sclera: Conjunctivae normal.     Pupils: Pupils are equal, round, and reactive to light.  Neck:     Thyroid: No thyroid mass or thyromegaly.  Cardiovascular:     Rate and Rhythm: Normal rate and regular rhythm.     Pulses: Normal pulses.          Radial pulses are 2+ on the right side and 2+ on the left side.     Heart sounds: Normal heart sounds. No murmur heard.   Pulmonary:     Effort: Pulmonary effort is normal. No respiratory distress.     Breath sounds: Normal breath sounds. No wheezing, rhonchi or rales.  Abdominal:     General: Abdomen is flat. Bowel sounds are normal. There is no distension.     Palpations: Abdomen is soft. There is no mass.     Tenderness: There is no abdominal tenderness. There is no guarding or rebound.     Hernia: No hernia is present.  Musculoskeletal:        General: Normal range of motion.     Cervical back: Normal range of motion and neck supple.     Right lower leg: No edema.     Left lower leg: No edema.  Lymphadenopathy:     Cervical: No cervical adenopathy.  Skin:    General: Skin is warm and dry.     Findings: No rash.  Neurological:     General: No focal deficit present.     Mental Status: He is alert and oriented to person, place, and time.     Comments: CN grossly intact, station and gait intact  Psychiatric:        Mood and Affect: Mood normal.        Behavior: Behavior normal.        Thought Content: Thought content normal.        Judgment: Judgment normal.       Results for orders placed or performed in visit on 03/26/20  TSH  Result Value Ref Range   TSH 1.99 0.35 - 4.50 uIU/mL  Lipid panel  Result Value Ref Range   Cholesterol 173 0 - 200 mg/dL   Triglycerides 322.0 (H) 0 - 149 mg/dL   HDL 36.30 (L) >39.00 mg/dL   VLDL 64.4 (H) 0.0 - 40.0 mg/dL   Total CHOL/HDL Ratio 5    NonHDL 136.95   Comprehensive metabolic panel  Result Value Ref Range   Sodium 140 135 - 145 mEq/L     Potassium 4.2 3.5 - 5.1 mEq/L   Chloride 101 96 - 112 mEq/L   CO2 30 19 - 32 mEq/L   Glucose, Bld 106 (H) 70 - 99 mg/dL   BUN 19 6 - 23 mg/dL   Creatinine, Ser 1.12 0.40 - 1.50 mg/dL   Total Bilirubin 0.6 0.2 - 1.2 mg/dL   Alkaline Phosphatase 89 39 -  117 U/L   AST 31 0 - 37 U/L   ALT 46 0 - 53 U/L   Total Protein 7.7 6.0 - 8.3 g/dL   Albumin 4.9 3.5 - 5.2 g/dL   GFR 83.81 >60.00 mL/min   Calcium 9.9 8.4 - 10.5 mg/dL  LDL cholesterol, direct  Result Value Ref Range   Direct LDL 83.0 mg/dL   Assessment & Plan:  This visit occurred during the SARS-CoV-2 public health emergency.  Safety protocols were in place, including screening questions prior to the visit, additional usage of staff PPE, and extensive cleaning of exam room while observing appropriate contact time as indicated for disinfecting solutions.   Problem List Items Addressed This Visit    OSA (obstructive sleep apnea)    Followed by cards, on CPAP daily.       Obesity, Class I, BMI 30-34.9    Encouraged healthy diet and lifestyle changes to affect sustainable weight loss.       Healthcare maintenance - Primary    Preventative protocols reviewed and updated unless pt declined. Discussed healthy diet and lifestyle.       GERD (gastroesophageal reflux disease)    Stable period on QOD PPI.  No red flags.       Relevant Medications   pantoprazole (PROTONIX) 40 MG tablet   Dyslipidemia    Chronic, on crestor 20mg  daily. Triglycerides elevated despite lovaza 1 capsule daily - will increase to BID and recheck levels in 6 months.  The ASCVD Risk score Mikey Bussing DC Jr., et al., 2013) failed to calculate for the following reasons:   The 2013 ASCVD risk score is only valid for ages 85 to 62       Relevant Medications   rosuvastatin (CRESTOR) 20 MG tablet   omega-3 acid ethyl esters (LOVAZA) 1 g capsule   Other Relevant Orders   Lipid panel   Hepatic function panel       Meds ordered this encounter  Medications   . rosuvastatin (CRESTOR) 20 MG tablet    Sig: Take 1 tablet (20 mg total) by mouth daily.    Dispense:  90 tablet    Refill:  3  . omega-3 acid ethyl esters (LOVAZA) 1 g capsule    Sig: Take 1 capsule (1 g total) by mouth 2 (two) times daily.    Dispense:  180 capsule    Refill:  3  . pantoprazole (PROTONIX) 40 MG tablet    Sig: Take 1 tablet (40 mg total) by mouth every other day.    Dispense:  45 tablet    Refill:  3   Orders Placed This Encounter  Procedures  . Lipid panel    Standing Status:   Future    Standing Expiration Date:   04/01/2021  . Hepatic function panel    Standing Status:   Future    Standing Expiration Date:   04/01/2021    Patient instructions: You are doing well today. Continue current medicines, increase lovaza to 1 pill twice daily.  Return in 6 months for lab visit only to check cholesterol again (fasting).  Return in 1 year for next physical.   Follow up plan: Return in about 1 year (around 04/01/2021) for annual exam, prior fasting for blood work.  Ria Bush, MD

## 2020-04-01 NOTE — Patient Instructions (Addendum)
You are doing well today. Continue current medicines, increase lovaza to 1 pill twice daily.  Return in 6 months for lab visit only to check cholesterol again (fasting).  Return in 1 year for next physical.   Health Maintenance, Male Adopting a healthy lifestyle and getting preventive care are important in promoting health and wellness. Ask your health care provider about:  The right schedule for you to have regular tests and exams.  Things you can do on your own to prevent diseases and keep yourself healthy. What should I know about diet, weight, and exercise? Eat a healthy diet   Eat a diet that includes plenty of vegetables, fruits, low-fat dairy products, and lean protein.  Do not eat a lot of foods that are high in solid fats, added sugars, or sodium. Maintain a healthy weight Body mass index (BMI) is a measurement that can be used to identify possible weight problems. It estimates body fat based on height and weight. Your health care provider can help determine your BMI and help you achieve or maintain a healthy weight. Get regular exercise Get regular exercise. This is one of the most important things you can do for your health. Most adults should:  Exercise for at least 150 minutes each week. The exercise should increase your heart rate and make you sweat (moderate-intensity exercise).  Do strengthening exercises at least twice a week. This is in addition to the moderate-intensity exercise.  Spend less time sitting. Even light physical activity can be beneficial. Watch cholesterol and blood lipids Have your blood tested for lipids and cholesterol at 37 years of age, then have this test every 5 years. You may need to have your cholesterol levels checked more often if:  Your lipid or cholesterol levels are high.  You are older than 37 years of age.  You are at high risk for heart disease. What should I know about cancer screening? Many types of cancers can be detected  early and may often be prevented. Depending on your health history and family history, you may need to have cancer screening at various ages. This may include screening for:  Colorectal cancer.  Prostate cancer.  Skin cancer.  Lung cancer. What should I know about heart disease, diabetes, and high blood pressure? Blood pressure and heart disease  High blood pressure causes heart disease and increases the risk of stroke. This is more likely to develop in people who have high blood pressure readings, are of African descent, or are overweight.  Talk with your health care provider about your target blood pressure readings.  Have your blood pressure checked: ? Every 3-5 years if you are 35-39 years of age. ? Every year if you are 103 years old or older.  If you are between the ages of 83 and 37 and are a current or former smoker, ask your health care provider if you should have a one-time screening for abdominal aortic aneurysm (AAA). Diabetes Have regular diabetes screenings. This checks your fasting blood sugar level. Have the screening done:  Once every three years after age 73 if you are at a normal weight and have a low risk for diabetes.  More often and at a younger age if you are overweight or have a high risk for diabetes. What should I know about preventing infection? Hepatitis B If you have a higher risk for hepatitis B, you should be screened for this virus. Talk with your health care provider to find out if you are at  risk for hepatitis B infection. Hepatitis C Blood testing is recommended for:  Everyone born from [birth year] through 1965.  Anyone with known risk factors for hepatitis C. Sexually transmitted infections (STIs)  You should be screened each year for STIs, including gonorrhea and chlamydia, if: ? You are sexually active and are younger than 37 years of age. ? You are older than 37 years of age and your health care provider tells you that you are at risk for this  type of infection. ? Your sexual activity has changed since you were last screened, and you are at increased risk for chlamydia or gonorrhea. Ask your health care provider if you are at risk.  Ask your health care provider about whether you are at high risk for HIV. Your health care provider may recommend a prescription medicine to help prevent HIV infection. If you choose to take medicine to prevent HIV, you should first get tested for HIV. You should then be tested every 3 months for as long as you are taking the medicine. Follow these instructions at home: Lifestyle  Do not use any products that contain nicotine or tobacco, such as cigarettes, e-cigarettes, and chewing tobacco. If you need help quitting, ask your health care provider.  Do not use street drugs.  Do not share needles.  Ask your health care provider for help if you need support or information about quitting drugs. Alcohol use  Do not drink alcohol if your health care provider tells you not to drink.  If you drink alcohol: ? Limit how much you have to 0-2 drinks a day. ? Be aware of how much alcohol is in your drink. In the [place] one drink equals one 12 oz bottle of beer (355 mL), one 5 oz glass of wine (148 mL), or one 1 oz glass of hard liquor (44 mL). General instructions  Schedule regular health, dental, and eye exams.  Stay current with your vaccines.  Tell your health care provider if: ? You often feel depressed. ? You have ever been abused or do not feel safe at home. Summary  Adopting a healthy lifestyle and getting preventive care are important in promoting health and wellness.  Follow your health care provider's instructions about healthy diet, exercising, and getting tested or screened for diseases.  Follow your health care provider's instructions on monitoring your cholesterol and blood pressure. This information is not intended to replace advice given to you by your health care provider. Make sure  you discuss any questions you have with your health care provider. Document Revised: 04/18/2018 Document Reviewed: 04/18/2018 Elsevier Patient Education  2020 [employer].

## 2020-04-01 NOTE — Assessment & Plan Note (Signed)
Encouraged healthy diet and lifestyle changes to affect sustainable weight loss.  

## 2020-04-01 NOTE — Assessment & Plan Note (Signed)
Preventative protocols reviewed and updated unless pt declined. Discussed healthy diet and lifestyle.  

## 2020-05-08 ENCOUNTER — Other Ambulatory Visit: Payer: Self-pay | Admitting: Family Medicine

## 2020-09-30 ENCOUNTER — Other Ambulatory Visit: Payer: 59

## 2021-04-12 ENCOUNTER — Encounter: Payer: Self-pay | Admitting: Family Medicine

## 2021-04-12 ENCOUNTER — Other Ambulatory Visit: Payer: Self-pay

## 2021-04-12 ENCOUNTER — Ambulatory Visit (INDEPENDENT_AMBULATORY_CARE_PROVIDER_SITE_OTHER): Payer: 59 | Admitting: Family Medicine

## 2021-04-12 VITALS — BP 122/82 | HR 71 | Temp 98.9°F | Ht 72.5 in | Wt 213.4 lb

## 2021-04-12 DIAGNOSIS — K219 Gastro-esophageal reflux disease without esophagitis: Secondary | ICD-10-CM

## 2021-04-12 DIAGNOSIS — G4733 Obstructive sleep apnea (adult) (pediatric): Secondary | ICD-10-CM

## 2021-04-12 DIAGNOSIS — R4589 Other symptoms and signs involving emotional state: Secondary | ICD-10-CM | POA: Diagnosis not present

## 2021-04-12 DIAGNOSIS — E785 Hyperlipidemia, unspecified: Secondary | ICD-10-CM | POA: Diagnosis not present

## 2021-04-12 DIAGNOSIS — Z0000 Encounter for general adult medical examination without abnormal findings: Secondary | ICD-10-CM | POA: Diagnosis not present

## 2021-04-12 DIAGNOSIS — R040 Epistaxis: Secondary | ICD-10-CM | POA: Insufficient documentation

## 2021-04-12 DIAGNOSIS — E663 Overweight: Secondary | ICD-10-CM

## 2021-04-12 DIAGNOSIS — Z1159 Encounter for screening for other viral diseases: Secondary | ICD-10-CM

## 2021-04-12 LAB — LIPID PANEL
Cholesterol: 192 mg/dL (ref 0–200)
HDL: 36.7 mg/dL — ABNORMAL LOW (ref >39.00)
NonHDL: 154.91
Total CHOL/HDL Ratio: 5
Triglycerides: 366 mg/dL — ABNORMAL HIGH (ref 0.0–149.0)
VLDL: 73.2 mg/dL — ABNORMAL HIGH (ref 0.0–40.0)

## 2021-04-12 LAB — COMPREHENSIVE METABOLIC PANEL
ALT: 34 U/L (ref 0–53)
AST: 22 U/L (ref 0–37)
Albumin: 4.8 g/dL (ref 3.5–5.2)
Alkaline Phosphatase: 80 U/L (ref 39–117)
BUN: 17 mg/dL (ref 6–23)
CO2: 31 mEq/L (ref 19–32)
Calcium: 10.1 mg/dL (ref 8.4–10.5)
Chloride: 97 mEq/L (ref 96–112)
Creatinine, Ser: 0.94 mg/dL (ref 0.40–1.50)
GFR: 102.67 mL/min (ref >60.00)
Glucose, Bld: 95 mg/dL (ref 70–99)
Potassium: 3.9 mEq/L (ref 3.5–5.1)
Sodium: 136 mEq/L (ref 135–145)
Total Bilirubin: 0.6 mg/dL (ref 0.2–1.2)
Total Protein: 8.3 g/dL (ref 6.0–8.3)

## 2021-04-12 LAB — TESTOSTERONE: Testosterone: 310.71 ng/dL (ref 300.00–890.00)

## 2021-04-12 LAB — LDL CHOLESTEROL, DIRECT: Direct LDL: 89 mg/dL

## 2021-04-12 MED ORDER — OMEGA-3-ACID ETHYL ESTERS 1 G PO CAPS
1.0000 g | ORAL_CAPSULE | Freq: Two times a day (BID) | ORAL | 3 refills | Status: DC
Start: 2021-04-12 — End: 2022-04-18

## 2021-04-12 MED ORDER — ROSUVASTATIN CALCIUM 20 MG PO TABS
20.0000 mg | ORAL_TABLET | Freq: Every day | ORAL | 3 refills | Status: DC
Start: 2021-04-12 — End: 2022-04-18

## 2021-04-12 NOTE — Assessment & Plan Note (Addendum)
Noted over the last few weeks.  Benign anterior nasal exam today.  Anticipate related to dry mucosa as significant improvement with nasal saline irrigation - he will let me know if ongoing for ENT eval.

## 2021-04-12 NOTE — Assessment & Plan Note (Signed)
Improved with weight loss.  Now only taking QOD PRN.

## 2021-04-12 NOTE — Progress Notes (Signed)
Patient ID: Francisco Mendez, male    DOB: 11-Jul-1980, 40 y.o.   MRN: [id number]  This visit was conducted in person.  BP 122/82   Pulse 71   Temp 98.9 F (37.2 C) (Temporal)   Ht 6' 0.5" (1.842 m)   Wt 213 lb 7 oz (96.8 kg)   SpO2 97%   BMI 28.55 kg/m    CC: CPE Subjective:   HPI: Francisco Mendez is a 40 y.o. male presenting on 04/12/2021 for Annual Exam   GERD managed with PPI QOD. Actually this past year he's not been using as regularly - weight loss has helped. Jogging more regularly, weight loss of 20 lbs total.   OSA on CPAP nightly followed by cardiology.   Started adzenys XR for ADHD . Continues lovaza with crestor nightly - tolerates this well.   Some nosebleeds recentl - all R sided, nasal saline irrigation has helped.   Some depressed mood, alternating with readings. No significant fatigue, sex drive overall ok. Requests T levels checked.  Preventative: Flu shot - declines COVID vaccines - declines  Td 2018 Seat belt use discussed  Sunscreen use discussed. No changing moles on skin. H/o BCC removed from face. Sees dermatologist yearly.  Sleep - averaging 7-8 hours/night  Non smoker. Occasional cigar. No smokeless tobacco.  Alcohol - 1-2 beers a few times a week Dentist q6 mo Eye exam sees regularly  Caffeine: 2 cups soda/day Lives with wife, 1 daughter (2011), 2 dogs Occupation: was Engineer, structural, now at [employer]  Edu: HS Activity: jogging and push ups 3x/wk Diet: good water, improving veggies, eats meats and potatoes     Relevant past medical, surgical, family and social history reviewed and updated as indicated. Interim medical history since our last visit reviewed. Allergies and medications reviewed and updated. Outpatient Medications Prior to Visit  Medication Sig Dispense Refill   Amphetamine ER (ADZENYS XR-ODT) 15.7 MG TBED Take 1 tablet by mouth daily.     Multiple Vitamins-Minerals (MULTIVITAMIN ADULT PO) Take 1 tablet by mouth  daily.     omega-3 acid ethyl esters (LOVAZA) 1 g capsule Take 1 capsule (1 g total) by mouth 2 (two) times daily. 180 capsule 3   rosuvastatin (CRESTOR) 20 MG tablet Take 1 tablet (20 mg total) by mouth daily. 90 tablet 3   pantoprazole (PROTONIX) 40 MG tablet Take 1 tablet (40 mg total) by mouth every other day as needed (GERD).     pantoprazole (PROTONIX) 40 MG tablet TAKE 1 TABLET BY MOUTH EVERY DAY (Patient not taking: Reported on 04/12/2021) 90 tablet 3   No facility-administered medications prior to visit.     Per HPI unless specifically indicated in ROS section below Review of Systems  Constitutional:  Negative for activity change, appetite change, chills, fatigue, fever and unexpected weight change.  HENT:  Negative for hearing loss.   Eyes:  Negative for visual disturbance.  Respiratory:  Negative for cough, chest tightness, shortness of breath and wheezing.   Cardiovascular:  Negative for chest pain, palpitations and leg swelling.  Gastrointestinal:  Negative for abdominal distention, abdominal pain, blood in stool, constipation, diarrhea, nausea and vomiting.  Genitourinary:  Negative for difficulty urinating and hematuria.  Musculoskeletal:  Negative for arthralgias, myalgias and neck pain.  Skin:  Negative for rash.  Neurological:  Negative for dizziness, seizures, syncope and headaches.  Hematological:  Negative for adenopathy. Does not bruise/bleed easily.  Psychiatric/Behavioral:  Negative for dysphoric mood. The patient is  not nervous/anxious.    Objective:  BP 122/82   Pulse 71   Temp 98.9 F (37.2 C) (Temporal)   Ht 6' 0.5" (1.842 m)   Wt 213 lb 7 oz (96.8 kg)   SpO2 97%   BMI 28.55 kg/m   Wt Readings from Last 3 Encounters:  04/12/21 213 lb 7 oz (96.8 kg)  04/01/20 233 lb 6 oz (105.9 kg)  05/13/19 230 lb (104.3 kg)      Physical Exam Vitals and nursing note reviewed.  Constitutional:      General: He is not in acute distress.    Appearance: Normal  appearance. He is well-developed. He is not ill-appearing.  HENT:     Head: Normocephalic and atraumatic.     Right Ear: Hearing, tympanic membrane, ear canal and external ear normal.     Left Ear: Hearing, tympanic membrane, ear canal and external ear normal.     Nose: Nose normal. No congestion.     Right Nostril: No foreign body, epistaxis or septal hematoma.     Left Nostril: No foreign body, epistaxis or septal hematoma.     Comments: No obvious cause of bleed noted    Mouth/Throat:     Mouth: Mucous membranes are moist.     Pharynx: Oropharynx is clear. No oropharyngeal exudate or posterior oropharyngeal erythema.  Eyes:     General: No scleral icterus.    Extraocular Movements: Extraocular movements intact.     Conjunctiva/sclera: Conjunctivae normal.     Pupils: Pupils are equal, round, and reactive to light.  Neck:     Thyroid: No thyroid mass or thyromegaly.  Cardiovascular:     Rate and Rhythm: Normal rate and regular rhythm.     Pulses: Normal pulses.          Radial pulses are 2+ on the right side and 2+ on the left side.     Heart sounds: Normal heart sounds. No murmur heard. Pulmonary:     Effort: Pulmonary effort is normal. No respiratory distress.     Breath sounds: Normal breath sounds. No wheezing, rhonchi or rales.  Abdominal:     General: Bowel sounds are normal. There is no distension.     Palpations: Abdomen is soft. There is no mass.     Tenderness: There is no abdominal tenderness. There is no guarding or rebound.     Hernia: No hernia is present.  Musculoskeletal:        General: Normal range of motion.     Cervical back: Normal range of motion and neck supple.     Right lower leg: No edema.     Left lower leg: No edema.  Lymphadenopathy:     Cervical: No cervical adenopathy.  Skin:    General: Skin is warm and dry.     Findings: No rash.  Neurological:     General: No focal deficit present.     Mental Status: He is alert and oriented to person,  place, and time.  Psychiatric:        Mood and Affect: Mood normal.        Behavior: Behavior normal.        Thought Content: Thought content normal.        Judgment: Judgment normal.      Results for orders placed or performed in visit on 03/26/20  TSH  Result Value Ref Range   TSH 1.99 0.35 - 4.50 uIU/mL  Lipid panel  Result Value Ref Range  Cholesterol 173 0 - 200 mg/dL   Triglycerides 322.0 (H) 0.0 - 149.0 mg/dL   HDL 36.30 (L) >39.00 mg/dL   VLDL 64.4 (H) 0.0 - 40.0 mg/dL   Total CHOL/HDL Ratio 5    NonHDL 136.95   Comprehensive metabolic panel  Result Value Ref Range   Sodium 140 135 - 145 mEq/L   Potassium 4.2 3.5 - 5.1 mEq/L   Chloride 101 96 - 112 mEq/L   CO2 30 19 - 32 mEq/L   Glucose, Bld 106 (H) 70 - 99 mg/dL   BUN 19 6 - 23 mg/dL   Creatinine, Ser 1.12 0.40 - 1.50 mg/dL   Total Bilirubin 0.6 0.2 - 1.2 mg/dL   Alkaline Phosphatase 89 39 - 117 U/L   AST 31 0 - 37 U/L   ALT 46 0 - 53 U/L   Total Protein 7.7 6.0 - 8.3 g/dL   Albumin 4.9 3.5 - 5.2 g/dL   GFR 83.81 >60.00 mL/min   Calcium 9.9 8.4 - 10.5 mg/dL  LDL cholesterol, direct  Result Value Ref Range   Direct LDL 83.0 mg/dL    Assessment & Plan:  This visit occurred during the SARS-CoV-2 public health emergency.  Safety protocols were in place, including screening questions prior to the visit, additional usage of staff PPE, and extensive cleaning of exam room while observing appropriate contact time as indicated for disinfecting solutions.   Problem List Items Addressed This Visit     Healthcare maintenance - Primary (Chronic)    Preventative protocols reviewed and updated unless pt declined. Discussed healthy diet and lifestyle.       Dyslipidemia    Chronic, stable on rosuvastatin nightly with lovaza. The ASCVD Risk score (Arnett DK, et al., 2019) failed to calculate for the following reasons:   The 2019 ASCVD risk score is only valid for ages 3 to 58       Relevant Medications   omega-3  acid ethyl esters (LOVAZA) 1 g capsule   rosuvastatin (CRESTOR) 20 MG tablet   Other Relevant Orders   Lipid panel   Comprehensive metabolic panel   GERD (gastroesophageal reflux disease)    Improved with weight loss.  Now only taking QOD PRN.       Relevant Medications   pantoprazole (PROTONIX) 40 MG tablet   OSA (obstructive sleep apnea)    Continues CPAP nightly.       Overweight    Congratulated on weight loss to date through healthy diet and lifestyle changes.       Right-sided epistaxis    Noted over the last few weeks.  Benign anterior nasal exam today.  Anticipate related to dry mucosa as significant improvement with nasal saline irrigation - he will let me know if ongoing for ENT eval.      Other Visit Diagnoses     Need for hepatitis C screening test       Relevant Orders   Hepatitis C antibody   Depressed mood       Relevant Orders   Testosterone        Meds ordered this encounter  Medications   omega-3 acid ethyl esters (LOVAZA) 1 g capsule    Sig: Take 1 capsule (1 g total) by mouth 2 (two) times daily.    Dispense:  180 capsule    Refill:  3   rosuvastatin (CRESTOR) 20 MG tablet    Sig: Take 1 tablet (20 mg total) by mouth daily.    Dispense:  90 tablet    Refill:  3   Orders Placed This Encounter  Procedures   Lipid panel    Standing Status:   Future    Number of Occurrences:   1    Standing Expiration Date:   04/12/2022   Comprehensive metabolic panel    Standing Status:   Future    Number of Occurrences:   1    Standing Expiration Date:   04/12/2022   Testosterone    Standing Status:   Future    Number of Occurrences:   1    Standing Expiration Date:   04/12/2022   Hepatitis C antibody    Standing Status:   Future    Number of Occurrences:   1    Standing Expiration Date:   04/12/2022     Patient instructions: You are doing well today. Congratulations on healthy changes and weight loss! Labs today  Return as needed or in 1 year  for next physical  Follow up plan: Return in about 1 year (around 04/12/2022) for annual exam, prior fasting for blood work.  Ria Bush, MD

## 2021-04-12 NOTE — Assessment & Plan Note (Signed)
Chronic, stable on rosuvastatin nightly with lovaza. The ASCVD Risk score (Arnett DK, et al., 2019) failed to calculate for the following reasons:   The 2019 ASCVD risk score is only valid for ages 67 to 63

## 2021-04-12 NOTE — Assessment & Plan Note (Signed)
Congratulated on weight loss to date through healthy diet and lifestyle changes.

## 2021-04-12 NOTE — Patient Instructions (Signed)
You are doing well today. Congratulations on healthy changes and weight loss! Labs today  Return as needed or in 1 year for next physical  Health Maintenance, Male Adopting a healthy lifestyle and getting preventive care are important in promoting health and wellness. Ask your health care provider about: The right schedule for you to have regular tests and exams. Things you can do on your own to prevent diseases and keep yourself healthy. What should I know about diet, weight, and exercise? Eat a healthy diet  Eat a diet that includes plenty of vegetables, fruits, low-fat dairy products, and lean protein. Do not eat a lot of foods that are high in solid fats, added sugars, or sodium. Maintain a healthy weight Body mass index (BMI) is a measurement that can be used to identify possible weight problems. It estimates body fat based on height and weight. Your health care provider can help determine your BMI and help you achieve or maintain a healthy weight. Get regular exercise Get regular exercise. This is one of the most important things you can do for your health. Most adults should: Exercise for at least 150 minutes each week. The exercise should increase your heart rate and make you sweat (moderate-intensity exercise). Do strengthening exercises at least twice a week. This is in addition to the moderate-intensity exercise. Spend less time sitting. Even light physical activity can be beneficial. Watch cholesterol and blood lipids Have your blood tested for lipids and cholesterol at 38 years of age, then have this test every 5 years. You may need to have your cholesterol levels checked more often if: Your lipid or cholesterol levels are high. You are older than 38 years of age. You are at high risk for heart disease. What should I know about cancer screening? Many types of cancers can be detected early and may often be prevented. Depending on your health history and family history, you may  need to have cancer screening at various ages. This may include screening for: Colorectal cancer. Prostate cancer. Skin cancer. Lung cancer. What should I know about heart disease, diabetes, and high blood pressure? Blood pressure and heart disease High blood pressure causes heart disease and increases the risk of stroke. This is more likely to develop in people who have high blood pressure readings or are overweight. Talk with your health care provider about your target blood pressure readings. Have your blood pressure checked: Every 3-5 years if you are 41-48 years of age. Every year if you are 106 years old or older. If you are between the ages of 10 and 65 and are a current or former smoker, ask your health care provider if you should have a one-time screening for abdominal aortic aneurysm (AAA). Diabetes Have regular diabetes screenings. This checks your fasting blood sugar level. Have the screening done: Once every three years after age 26 if you are at a normal weight and have a low risk for diabetes. More often and at a younger age if you are overweight or have a high risk for diabetes. What should I know about preventing infection? Hepatitis B If you have a higher risk for hepatitis B, you should be screened for this virus. Talk with your health care provider to find out if you are at risk for hepatitis B infection. Hepatitis C Blood testing is recommended for: Everyone born from [birth year] through 1965. Anyone with known risk factors for hepatitis C. Sexually transmitted infections (STIs) You should be screened each year for STIs, including  gonorrhea and chlamydia, if: You are sexually active and are younger than 38 years of age. You are older than 38 years of age and your health care provider tells you that you are at risk for this type of infection. Your sexual activity has changed since you were last screened, and you are at increased risk for chlamydia or gonorrhea. Ask your health  care provider if you are at risk. Ask your health care provider about whether you are at high risk for HIV. Your health care provider may recommend a prescription medicine to help prevent HIV infection. If you choose to take medicine to prevent HIV, you should first get tested for HIV. You should then be tested every 3 months for as long as you are taking the medicine. Follow these instructions at home: Alcohol use Do not drink alcohol if your health care provider tells you not to drink. If you drink alcohol: Limit how much you have to 0-2 drinks a day. Know how much alcohol is in your drink. In the [place] one drink equals one 12 oz bottle of beer (355 mL), one 5 oz glass of wine (148 mL), or one 1 oz glass of hard liquor (44 mL). Lifestyle Do not use any products that contain nicotine or tobacco. These products include cigarettes, chewing tobacco, and vaping devices, such as e-cigarettes. If you need help quitting, ask your health care provider. Do not use street drugs. Do not share needles. Ask your health care provider for help if you need support or information about quitting drugs. General instructions Schedule regular health, dental, and eye exams. Stay current with your vaccines. Tell your health care provider if: You often feel depressed. You have ever been abused or do not feel safe at home. Summary Adopting a healthy lifestyle and getting preventive care are important in promoting health and wellness. Follow your health care provider's instructions about healthy diet, exercising, and getting tested or screened for diseases. Follow your health care provider's instructions on monitoring your cholesterol and blood pressure. This information is not intended to replace advice given to you by your health care provider. Make sure you discuss any questions you have with your health care provider. Document Revised: 09/14/2020 Document Reviewed: 09/14/2020 Elsevier Patient Education  [employer].

## 2021-04-12 NOTE — Assessment & Plan Note (Signed)
Continues CPAP nightly.

## 2021-04-12 NOTE — Assessment & Plan Note (Signed)
Preventative protocols reviewed and updated unless pt declined. Discussed healthy diet and lifestyle.  

## 2021-04-13 LAB — HEPATITIS C ANTIBODY
Hepatitis C Ab: NONREACTIVE
SIGNAL TO CUT-OFF: 0.03 (ref <1.00)

## 2021-04-21 ENCOUNTER — Other Ambulatory Visit: Payer: Self-pay | Admitting: Family Medicine

## 2021-04-22 ENCOUNTER — Encounter: Payer: Self-pay | Admitting: Family Medicine

## 2021-04-22 DIAGNOSIS — R040 Epistaxis: Secondary | ICD-10-CM

## 2021-04-22 NOTE — Telephone Encounter (Signed)
ENT referral placed.  I will forward to our referral coordinator as I know we've had some trouble getting referrals through to [facility] in the past.

## 2021-04-22 NOTE — Telephone Encounter (Signed)
I spoke with pt; no nose bleed now; last nose bleed was last night sometime. Pt said the nosebleed on rt side of nose did not wake pt up and he slept until he got up this morning around 5:30 AM. Pt said there were 2 spots on pillow and bed the size of a softball. The blood spot was pinkish and not fully dark red color of blood.Pt said he talked with Dr Darnell Level about nose bleed when he had CPX first of Dec 2022. Pt said at times he can taste blood but blood not running out of nose. Pt has not had H/A and no dizziness. Pt worked all day without problem. Pt lifting weights 3 - 4 times a wk. Pt said usually nosebleed stops on its own. Advised pt can put pressure on nose from top of nose like a clothes pin for 15 mins without taking the pressure off.  Pt said he is not weak and is not feeling bad that the nose bleed is more of an annoyance than anything else but pt would like for them to stop. Pt said last night he put small amt of neosporin in opening of nose and I advised that or vaseline should be OK just in the opening and not to go up into nose. Pt voiced understanding. UC & ED precautions given and pt voiced understanding.pt said he tried to get ENT appt at couple of offices but appts were more than 30 days. Pt request cb on 04/23/21 after Dr Darnell Level reviews this note. Sending note to Dr Darnell Level who ois out of office and Lattie Haw CMA. Its is after 5 PM so advised pt again about UC & ED precautions and pt voiced understanding and was comfortable with what we have discussed.

## 2021-05-05 NOTE — Telephone Encounter (Signed)
I do not see a referral in the system - does not look like one was ever placed

## 2021-05-06 NOTE — Telephone Encounter (Signed)
New referral placed.

## 2021-05-06 NOTE — Addendum Note (Signed)
Addended by: Ria Bush on: 05/06/2021 01:08 PM   Modules accepted: Orders

## 2022-04-18 ENCOUNTER — Encounter: Payer: Self-pay | Admitting: Family Medicine

## 2022-04-18 ENCOUNTER — Ambulatory Visit (INDEPENDENT_AMBULATORY_CARE_PROVIDER_SITE_OTHER): Payer: 59 | Admitting: Family Medicine

## 2022-04-18 VITALS — BP 128/82 | HR 63 | Temp 97.7°F | Ht 72.0 in | Wt 214.2 lb

## 2022-04-18 DIAGNOSIS — G4733 Obstructive sleep apnea (adult) (pediatric): Secondary | ICD-10-CM

## 2022-04-18 DIAGNOSIS — R040 Epistaxis: Secondary | ICD-10-CM

## 2022-04-18 DIAGNOSIS — Z0000 Encounter for general adult medical examination without abnormal findings: Secondary | ICD-10-CM | POA: Diagnosis not present

## 2022-04-18 DIAGNOSIS — Z8042 Family history of malignant neoplasm of prostate: Secondary | ICD-10-CM

## 2022-04-18 DIAGNOSIS — Z8249 Family history of ischemic heart disease and other diseases of the circulatory system: Secondary | ICD-10-CM

## 2022-04-18 DIAGNOSIS — E785 Hyperlipidemia, unspecified: Secondary | ICD-10-CM | POA: Diagnosis not present

## 2022-04-18 DIAGNOSIS — F909 Attention-deficit hyperactivity disorder, unspecified type: Secondary | ICD-10-CM

## 2022-04-18 DIAGNOSIS — K219 Gastro-esophageal reflux disease without esophagitis: Secondary | ICD-10-CM

## 2022-04-18 MED ORDER — OMEGA-3-ACID ETHYL ESTERS 1 G PO CAPS: 1.0000 g | ORAL_CAPSULE | Freq: Two times a day (BID) | ORAL | 4 refills | Status: DC

## 2022-04-18 NOTE — Assessment & Plan Note (Signed)
Discussed starting screening at age 41yo

## 2022-04-18 NOTE — Assessment & Plan Note (Signed)
This improved with weight loss, now off CPAP

## 2022-04-18 NOTE — Assessment & Plan Note (Signed)
This improved with weight loss, now off PPI

## 2022-04-18 NOTE — Assessment & Plan Note (Addendum)
Preventative protocols reviewed and updated unless pt declined. Discussed healthy diet and lifestyle.  

## 2022-04-18 NOTE — Patient Instructions (Addendum)
Return at your convenience for fasting labwork.  Good to see you today Return as needed or in 1 year for next physical.   Health Maintenance, Male Adopting a healthy lifestyle and getting preventive care are important in promoting health and wellness. Ask your health care provider about: The right schedule for you to have regular tests and exams. Things you can do on your own to prevent diseases and keep yourself healthy. What should I know about diet, weight, and exercise? Eat a healthy diet  Eat a diet that includes plenty of vegetables, fruits, low-fat dairy products, and lean protein. Do not eat a lot of foods that are high in solid fats, added sugars, or sodium. Maintain a healthy weight Body mass index (BMI) is a measurement that can be used to identify possible weight problems. It estimates body fat based on height and weight. Your health care provider can help determine your BMI and help you achieve or maintain a healthy weight. Get regular exercise Get regular exercise. This is one of the most important things you can do for your health. Most adults should: Exercise for at least 150 minutes each week. The exercise should increase your heart rate and make you sweat (moderate-intensity exercise). Do strengthening exercises at least twice a week. This is in addition to the moderate-intensity exercise. Spend less time sitting. Even light physical activity can be beneficial. Watch cholesterol and blood lipids Have your blood tested for lipids and cholesterol at 39 years of age, then have this test every 5 years. You may need to have your cholesterol levels checked more often if: Your lipid or cholesterol levels are high. You are older than 39 years of age. You are at high risk for heart disease. What should I know about cancer screening? Many types of cancers can be detected early and may often be prevented. Depending on your health history and family history, you may need to have  cancer screening at various ages. This may include screening for: Colorectal cancer. Prostate cancer. Skin cancer. Lung cancer. What should I know about heart disease, diabetes, and high blood pressure? Blood pressure and heart disease High blood pressure causes heart disease and increases the risk of stroke. This is more likely to develop in people who have high blood pressure readings or are overweight. Talk with your health care provider about your target blood pressure readings. Have your blood pressure checked: Every 3-5 years if you are 5-80 years of age. Every year if you are 17 years old or older. If you are between the ages of 72 and 16 and are a current or former smoker, ask your health care provider if you should have a one-time screening for abdominal aortic aneurysm (AAA). Diabetes Have regular diabetes screenings. This checks your fasting blood sugar level. Have the screening done: Once every three years after age 48 if you are at a normal weight and have a low risk for diabetes. More often and at a younger age if you are overweight or have a high risk for diabetes. What should I know about preventing infection? Hepatitis B If you have a higher risk for hepatitis B, you should be screened for this virus. Talk with your health care provider to find out if you are at risk for hepatitis B infection. Hepatitis C Blood testing is recommended for: Everyone born from [birth year] through 1963. Anyone with known risk factors for hepatitis C. Sexually transmitted infections (STIs) You should be screened each year for STIs, including gonorrhea  and chlamydia, if: You are sexually active and are younger than 39 years of age. You are older than 39 years of age and your health care provider tells you that you are at risk for this type of infection. Your sexual activity has changed since you were last screened, and you are at increased risk for chlamydia or gonorrhea. Ask your health care  provider if you are at risk. Ask your health care provider about whether you are at high risk for HIV. Your health care provider may recommend a prescription medicine to help prevent HIV infection. If you choose to take medicine to prevent HIV, you should first get tested for HIV. You should then be tested every 3 months for as long as you are taking the medicine. Follow these instructions at home: Alcohol use Do not drink alcohol if your health care provider tells you not to drink. If you drink alcohol: Limit how much you have to 0-2 drinks a day. Know how much alcohol is in your drink. In the [place] one drink equals one 12 oz bottle of beer (355 mL), one 5 oz glass of wine (148 mL), or one 1 oz glass of hard liquor (44 mL). Lifestyle Do not use any products that contain nicotine or tobacco. These products include cigarettes, chewing tobacco, and vaping devices, such as e-cigarettes. If you need help quitting, ask your health care provider. Do not use street drugs. Do not share needles. Ask your health care provider for help if you need support or information about quitting drugs. General instructions Schedule regular health, dental, and eye exams. Stay current with your vaccines. Tell your health care provider if: You often feel depressed. You have ever been abused or do not feel safe at home. Summary Adopting a healthy lifestyle and getting preventive care are important in promoting health and wellness. Follow your health care provider's instructions about healthy diet, exercising, and getting tested or screened for diseases. Follow your health care provider's instructions on monitoring your cholesterol and blood pressure. This information is not intended to replace advice given to you by your health care provider. Make sure you discuss any questions you have with your health care provider. Document Revised: 09/14/2020 Document Reviewed: 09/14/2020 Elsevier Patient Education  [place].

## 2022-04-18 NOTE — Progress Notes (Signed)
Patient ID: Francisco Mendez, male    DOB: [birth year], 41 y.o.   MRN: [id number]  This visit was conducted in person.  BP 128/82   Pulse 63   Temp 97.7 F (36.5 C) (Temporal)   Ht 6' (1.829 m)   Wt 214 lb 3.2 oz (97.2 kg)   SpO2 98%   BMI 29.05 kg/m    CC: CPE Subjective:   HPI: Francisco Mendez is a 41 y.o. male presenting on 04/18/2022 for Annual Exam   Saw ENT - had hemangioma to sinuses on right - this is largely improved.  OSA off CPAP - improved with weight loss.    Started adzenys XR for ADHD. This is followed by Dr Haroldine Laws at [facility]  HLD - was on crestor and lovaza - but stopped crestor due to joint pains. Continues Lovaza. Now jogging 2-3 miles 2-3 times a week.   GERD - improved with weight loss.  Takes fruit/vegetable vitamin daily as well as multivitamin.   Preventative: Flu shot - declines  COVID vaccines - declines  Td 2008, 2018  Seat belt use discussed  Sunscreen use discussed. No changing moles on skin. H/o BCC removed from face. Sees dermatologist yearly.  (Dr Delaney Meigs) Sleep - averaging 7-8 hours/night  Non smoker. Occasional cigar. No smokeless tobacco.  Alcohol - 1-2 beers a few times a week  Dentist q6 mo  Eye exam - due   Caffeine: 2 cups soda/day Lives with wife, 1 daughter (2011), 2 dogs Occupation: was Engineer, structural, now at [employer]  Edu: HS Activity: jogging and push ups 3x/wk Diet: good water, fruits/vegetables, eats meats and potatoes     Relevant past medical, surgical, family and social history reviewed and updated as indicated. Interim medical history since our last visit reviewed. Allergies and medications reviewed and updated. Outpatient Medications Prior to Visit  Medication Sig Dispense Refill   Amphetamine ER (ADZENYS XR-ODT) 15.7 MG TBED Take 1 tablet by mouth daily.     Multiple Vitamins-Minerals (MULTIVITAMIN ADULT PO) Take 1 tablet by mouth daily.     omega-3 acid ethyl esters (LOVAZA) 1 g capsule  Take 1 capsule (1 g total) by mouth 2 (two) times daily. 180 capsule 3   pantoprazole (PROTONIX) 40 MG tablet Take 1 tablet (40 mg total) by mouth every other day as needed (GERD).     rosuvastatin (CRESTOR) 20 MG tablet Take 1 tablet (20 mg total) by mouth daily. (Patient not taking: Reported on 04/18/2022) 90 tablet 3   No facility-administered medications prior to visit.     Per HPI unless specifically indicated in ROS section below Review of Systems  Constitutional:  Negative for activity change, appetite change, chills, fatigue, fever and unexpected weight change.  HENT:  Negative for hearing loss.   Eyes:  Negative for visual disturbance.  Respiratory:  Negative for cough, chest tightness, shortness of breath and wheezing.   Cardiovascular:  Negative for chest pain, palpitations and leg swelling.  Gastrointestinal:  Negative for abdominal distention, abdominal pain, blood in stool, constipation, diarrhea, nausea and vomiting.  Genitourinary:  Negative for difficulty urinating and hematuria.  Musculoskeletal:  Negative for arthralgias, myalgias and neck pain.  Skin:  Negative for rash.  Neurological:  Negative for dizziness, seizures, syncope and headaches.  Hematological:  Negative for adenopathy. Does not bruise/bleed easily.  Psychiatric/Behavioral:  Negative for dysphoric mood. The patient is not nervous/anxious.     Objective:  BP 128/82   Pulse 63  Temp 97.7 F (36.5 C) (Temporal)   Ht 6' (1.829 m)   Wt 214 lb 3.2 oz (97.2 kg)   SpO2 98%   BMI 29.05 kg/m   Wt Readings from Last 3 Encounters:  04/18/22 214 lb 3.2 oz (97.2 kg)  04/12/21 213 lb 7 oz (96.8 kg)  04/01/20 233 lb 6 oz (105.9 kg)      Physical Exam Vitals and nursing note reviewed.  Constitutional:      General: He is not in acute distress.    Appearance: Normal appearance. He is well-developed. He is not ill-appearing.  HENT:     Head: Normocephalic and atraumatic.     Right Ear: Hearing, tympanic  membrane, ear canal and external ear normal.     Left Ear: Hearing, tympanic membrane, ear canal and external ear normal.     Nose: Nose normal.     Mouth/Throat:     Mouth: Mucous membranes are moist.     Pharynx: Oropharynx is clear. No oropharyngeal exudate or posterior oropharyngeal erythema.  Eyes:     General: No scleral icterus.    Extraocular Movements: Extraocular movements intact.     Conjunctiva/sclera: Conjunctivae normal.     Pupils: Pupils are equal, round, and reactive to light.  Neck:     Thyroid: No thyroid mass or thyromegaly.  Cardiovascular:     Rate and Rhythm: Normal rate and regular rhythm.     Pulses: Normal pulses.          Radial pulses are 2+ on the right side and 2+ on the left side.     Heart sounds: Normal heart sounds. No murmur heard. Pulmonary:     Effort: Pulmonary effort is normal. No respiratory distress.     Breath sounds: Normal breath sounds. No wheezing, rhonchi or rales.  Abdominal:     General: Bowel sounds are normal. There is no distension.     Palpations: Abdomen is soft. There is no mass.     Tenderness: There is no abdominal tenderness. There is no guarding or rebound.     Hernia: No hernia is present.  Musculoskeletal:        General: Normal range of motion.     Cervical back: Normal range of motion and neck supple.     Right lower leg: No edema.     Left lower leg: No edema.  Lymphadenopathy:     Cervical: No cervical adenopathy.  Skin:    General: Skin is warm and dry.     Findings: No rash.  Neurological:     General: No focal deficit present.     Mental Status: He is alert and oriented to person, place, and time.  Psychiatric:        Mood and Affect: Mood normal.        Behavior: Behavior normal.        Thought Content: Thought content normal.        Judgment: Judgment normal.       Assessment & Plan:   Problem List Items Addressed This Visit       Unprioritized   Healthcare maintenance - Primary (Chronic)     Preventative protocols reviewed and updated unless pt declined. Discussed healthy diet and lifestyle.       Dyslipidemia    Chronic issue predominantly triglycerides - he stopped crestor due to arthralgias. Will return for fasting lipid panel with lipoprotein a levels and then determine need for ongoing treatment, will continue Lovaza 1gm BID  at this time.  The ASCVD Risk score (Arnett DK, et al., 2019) failed to calculate for the following reasons:   The 2019 ASCVD risk score is only valid for ages 52 to 28       Relevant Medications   omega-3 acid ethyl esters (LOVAZA) 1 g capsule   Other Relevant Orders   Lipid panel   Comprehensive metabolic panel   Lipoprotein A (LPA)   Apolipoprotein B   GERD (gastroesophageal reflux disease)    This improved with weight loss, now off PPI       OSA (obstructive sleep apnea)    This improved with weight loss, now off CPAP      Right-sided epistaxis    Saw ENT told hemangioma, currently asxs.       Family history of premature CAD   Relevant Orders   Lipoprotein A (LPA)   Apolipoprotein B   Family history of prostate cancer in father    Discussed starting screening at age 19yo      ADHD    Adult ADHD followed by Dr Haroldine Laws on Adzenys XR-ODT 15.'7mg'$  daily.         Meds ordered this encounter  Medications   omega-3 acid ethyl esters (LOVAZA) 1 g capsule    Sig: Take 1 capsule (1 g total) by mouth 2 (two) times daily.    Dispense:  180 capsule    Refill:  4   Orders Placed This Encounter  Procedures   Lipid panel    Standing Status:   Future    Standing Expiration Date:   04/19/2023   Comprehensive metabolic panel    Standing Status:   Future    Standing Expiration Date:   04/19/2023   Lipoprotein A (LPA)    Standing Status:   Future    Standing Expiration Date:   04/19/2023   Apolipoprotein B    Standing Status:   Future    Standing Expiration Date:   04/19/2023     Patient instructions: Return at your convenience  for fasting labwork.  Good to see you today Return as needed or in 1 year for next physical.   Follow up plan: Return in about 1 year (around 04/19/2023) for annual exam, prior fasting for blood work.  Ria Bush, MD

## 2022-04-18 NOTE — Assessment & Plan Note (Addendum)
Chronic issue predominantly triglycerides - he stopped crestor due to arthralgias. Will return for fasting lipid panel with lipoprotein a levels and then determine need for ongoing treatment, will continue Lovaza 1gm BID at this time.  The ASCVD Risk score (Arnett DK, et al., 2019) failed to calculate for the following reasons:   The 2019 ASCVD risk score is only valid for ages 72 to 54

## 2022-04-18 NOTE — Assessment & Plan Note (Signed)
Saw ENT told hemangioma, currently asxs.

## 2022-04-18 NOTE — Assessment & Plan Note (Signed)
Adult ADHD followed by Dr Haroldine Laws on Adzenys XR-ODT 15.'7mg'$  daily.

## 2022-04-21 ENCOUNTER — Other Ambulatory Visit (INDEPENDENT_AMBULATORY_CARE_PROVIDER_SITE_OTHER): Payer: 59

## 2022-04-21 DIAGNOSIS — E785 Hyperlipidemia, unspecified: Secondary | ICD-10-CM

## 2022-04-21 DIAGNOSIS — Z8249 Family history of ischemic heart disease and other diseases of the circulatory system: Secondary | ICD-10-CM

## 2022-04-21 LAB — LIPID PANEL
Cholesterol: 276 mg/dL — ABNORMAL HIGH (ref 0–200)
HDL: 47.1 mg/dL (ref >39.00)
LDL Cholesterol: 190 mg/dL — ABNORMAL HIGH (ref 0–99)
NonHDL: 228.9
Total CHOL/HDL Ratio: 6
Triglycerides: 194 mg/dL — ABNORMAL HIGH (ref 0.0–149.0)
VLDL: 38.8 mg/dL (ref 0.0–40.0)

## 2022-04-21 LAB — COMPREHENSIVE METABOLIC PANEL
ALT: 34 U/L (ref 0–53)
AST: 23 U/L (ref 0–37)
Albumin: 5 g/dL (ref 3.5–5.2)
Alkaline Phosphatase: 77 U/L (ref 39–117)
BUN: 18 mg/dL (ref 6–23)
CO2: 32 mEq/L (ref 19–32)
Calcium: 10.1 mg/dL (ref 8.4–10.5)
Chloride: 97 mEq/L (ref 96–112)
Creatinine, Ser: 1.03 mg/dL (ref 0.40–1.50)
GFR: 91.34 mL/min (ref >60.00)
Glucose, Bld: 94 mg/dL (ref 70–99)
Potassium: 4.5 mEq/L (ref 3.5–5.1)
Sodium: 136 mEq/L (ref 135–145)
Total Bilirubin: 0.9 mg/dL (ref 0.2–1.2)
Total Protein: 8 g/dL (ref 6.0–8.3)

## 2022-04-22 LAB — APOLIPOPROTEIN B: Apolipoprotein B: 163 mg/dL — ABNORMAL HIGH (ref <90)

## 2022-04-25 LAB — LIPOPROTEIN A (LPA): Lipoprotein (a): 184 nmol/L — ABNORMAL HIGH (ref <75)

## 2022-04-27 ENCOUNTER — Encounter: Payer: Self-pay | Admitting: Family Medicine

## 2022-04-27 ENCOUNTER — Other Ambulatory Visit: Payer: Self-pay | Admitting: Family Medicine

## 2022-04-27 MED ORDER — ATORVASTATIN CALCIUM 40 MG PO TABS: 40.0000 mg | ORAL_TABLET | Freq: Every day | ORAL | 11 refills | Status: DC

## 2022-04-28 ENCOUNTER — Telehealth: Payer: Self-pay | Admitting: Family Medicine

## 2022-04-28 NOTE — Telephone Encounter (Signed)
Patient returned call to [facility] regarding his lab results,would like a cb.

## 2022-04-28 NOTE — Telephone Encounter (Signed)
Pt aware. (See Labs, Result Notes- 04/21/22.)

## 2023-01-04 ENCOUNTER — Ambulatory Visit
Admission: EM | Admit: 2023-01-04 | Discharge: 2023-01-04 | Disposition: A | Discharge status: Home / Self Care | Payer: 59 | Attending: Emergency Medicine | Admitting: Emergency Medicine

## 2023-01-04 DIAGNOSIS — W57XXXA Bitten or stung by nonvenomous insect and other nonvenomous arthropods, initial encounter: Secondary | ICD-10-CM

## 2023-01-04 DIAGNOSIS — Z23 Encounter for immunization: Secondary | ICD-10-CM

## 2023-01-04 DIAGNOSIS — S91301A Unspecified open wound, right foot, initial encounter: Secondary | ICD-10-CM

## 2023-01-04 DIAGNOSIS — S90861A Insect bite (nonvenomous), right foot, initial encounter: Secondary | ICD-10-CM | POA: Diagnosis not present

## 2023-01-04 MED ORDER — TETANUS-DIPHTH-ACELL PERTUSSIS 5-2.5-18.5 LF-MCG/0.5 IM SUSY
0.5000 mL | PREFILLED_SYRINGE | Freq: Once | INTRAMUSCULAR | Status: AC
  Administered 2023-01-04: 0.5 mL via INTRAMUSCULAR

## 2023-01-04 MED ORDER — MUPIROCIN 2 % EX OINT: 1.0000 | TOPICAL_OINTMENT | Freq: Two times a day (BID) | CUTANEOUS | 0 refills | Status: AC

## 2023-01-04 NOTE — Discharge Instructions (Addendum)
Your tetanus was updated today.  Keep your wound clean and dry.  Wash it gently twice a day with soap and water.  Apply the antibiotic ointment and a bandage twice a day.    Follow up if you see signs of worsening infection, such as increased redness, pus-like drainage, warmth, fever, chills, or other concerning symptoms.

## 2023-01-04 NOTE — ED Provider Notes (Signed)
Renaldo Fiddler    CSN: [id number] Arrival date & time: 01/04/23  0803      History   Chief Complaint Chief Complaint  Patient presents with   Insect Bite    HPI Francisco Mendez is a 42 y.o. male.  Patient presents with an open wound on his right foot x 2 days.  He was outside at a ball game and in the woods while wearing crocs.  He believes the wound is an insect bite or sting.  He scratched the area and now has an open wound.  He states it was pink and swollen yesterday but has improved today.  It is tender.  He has been treating it with peroxide and topical antibiotic ointment.  No fever, chills, purulent drainage, or other symptoms.  Last tetanus 2018.  The history is provided by the patient and medical records.    Past Medical History:  Diagnosis Date   Allergic rhinitis    Basal cell carcinoma 2016   Hematuria ~2001   normal workup with cystoscopy and CT   History of basal cell carcinoma 11/17/2014   left mid nasolabial   History of chicken pox    History of dysplastic nevus    left posterior shoulder, right post shoulder   OSA (obstructive sleep apnea) 08/2016   sleep study AHI 22 ([facility])    Patient Active Problem List   Diagnosis Date Noted   Family history of premature CAD 04/18/2022   Family history of prostate cancer in father 04/18/2022   ADHD 04/18/2022   Right-sided epistaxis 04/12/2021   Overweight 12/16/2016   Dyslipidemia 08/17/2016   GERD (gastroesophageal reflux disease) 08/17/2016   OSA (obstructive sleep apnea) 08/07/2016   Healthcare maintenance 05/22/2012    Past Surgical History:  Procedure Laterality Date   CYSTOSCOPY  2001   for hematuria, WNL       Home Medications    Prior to Admission medications   Medication Sig Start Date End Date Taking? Authorizing Provider  atorvastatin (LIPITOR) 40 MG tablet Take 1 tablet (40 mg total) by mouth daily. 04/27/22   Eustaquio Boyden, MD  mupirocin ointment (BACTROBAN) 2 % Apply 1  Application topically 2 (two) times daily. 01/04/23  Yes Mickie Bail, NP  Amphetamine ER (ADZENYS XR-ODT) 15.7 MG TBED Take 1 tablet by mouth daily.    [provider]  Multiple Vitamins-Minerals (MULTIVITAMIN ADULT PO) Take 1 tablet by mouth daily.    [provider]  omega-3 acid ethyl esters (LOVAZA) 1 g capsule Take 1 capsule (1 g total) by mouth 2 (two) times daily. 04/18/22   Eustaquio Boyden, MD    Family History Family History  Problem Relation Age of Onset   Hyperlipidemia Mother    Prostate cancer Father 61       s/p prostatectomy   CAD Maternal Grandmother    CAD Maternal Grandfather 46       smoker   Alzheimer's disease Paternal Grandfather    CAD Maternal Uncle 41       MI, smoker   Cancer Neg Hx    Diabetes Neg Hx     Social History Social History   Tobacco Use   Smoking status: Never   Smokeless tobacco: Former    Types: Snuff  Vaping Use   Vaping status: Never Used  Substance Use Topics   Alcohol use: Yes    Comment: Occasional   Drug use: No     Allergies   Crestor [rosuvastatin] and  Keflex [cephalexin]   Review of Systems Review of Systems  Constitutional:  Negative for chills and fever.  Musculoskeletal:  Negative for arthralgias, gait problem and joint swelling.  Skin:  Positive for wound. Negative for color change.  Neurological:  Negative for weakness and numbness.     Physical Exam Triage Vital Signs ED Triage Vitals  Encounter Vitals Group     BP      Systolic BP Percentile      Diastolic BP Percentile      Pulse      Resp      Temp      Temp src      SpO2      Weight      Height      Head Circumference      Peak Flow      Pain Score      Pain Loc      Pain Education      Exclude from Growth Chart    No data found.  Updated Vital Signs BP 133/87   Pulse 71   Temp 97.7 F (36.5 C)   Resp 18   SpO2 96%   Visual Acuity Right Eye Distance:   Left Eye Distance:   Bilateral Distance:    Right  Eye Near:   Left Eye Near:    Bilateral Near:     Physical Exam Constitutional:      General: He is not in acute distress. HENT:     Mouth/Throat:     Mouth: Mucous membranes are moist.  Cardiovascular:     Rate and Rhythm: Normal rate and regular rhythm.  Pulmonary:     Effort: Pulmonary effort is normal. No respiratory distress.  Musculoskeletal:        General: No swelling or deformity. Normal range of motion.  Skin:    General: Skin is warm and dry.     Findings: Lesion present.     Comments: Superficial open wound on right foot.  See picture.  Neurological:     General: No focal deficit present.     Mental Status: He is alert and oriented to person, place, and time.     Sensory: No sensory deficit.     Motor: No weakness.     Gait: Gait normal.  Psychiatric:        Mood and Affect: Mood normal.        Behavior: Behavior normal.      UC Treatments / Results  Labs (all labs ordered are listed, but only abnormal results are displayed) Labs Reviewed - No data to display  EKG   Radiology No results found.  Procedures Procedures (including critical care time)  Medications Ordered in UC Medications  Tdap (BOOSTRIX) injection 0.5 mL (has no administration in time range)    Initial Impression / Assessment and Plan / UC Course  I have reviewed the triage vital signs and the nursing notes.  Pertinent labs & imaging results that were available during my care of the patient were reviewed by me and considered in my medical decision making (see chart for details).   Open wound of right foot, insect bite of right foot.  Tetanus updated today.  Treating with mupirocin ointment.  Wound care instructions and signs of worsening infection discussed.  Instructed patient to follow-up right away if he notes signs of infection.  Education provided on wound care and insect bites.  He agrees to plan of care.   Final Clinical  Impressions(s) / UC Diagnoses   Final diagnoses:   Open wound of right foot, initial encounter  Insect bite of right foot, initial encounter     Discharge Instructions      Your tetanus was updated today.  Keep your wound clean and dry.  Wash it gently twice a day with soap and water.  Apply the antibiotic ointment and a bandage twice a day.    Follow up if you see signs of worsening infection, such as increased redness, pus-like drainage, warmth, fever, chills, or other concerning symptoms.        ED Prescriptions     Medication Sig Dispense Auth. Provider   mupirocin ointment (BACTROBAN) 2 % Apply 1 Application topically 2 (two) times daily. 22 g Mickie Bail, NP      PDMP not reviewed this encounter.   Mickie Bail, NP 01/04/23 [phone]

## 2023-01-04 NOTE — ED Triage Notes (Signed)
Patient to Urgent Care with complaints of an insect bite present to the side of his right foot. Unsure of what insect. No other symptoms.  Symptoms started on Monday. Reports yesterday the area started looking raw and spreading. Has had some clear drainage. Area is tender. Has been covering the area.

## 2023-02-22 ENCOUNTER — Other Ambulatory Visit: Payer: Self-pay | Admitting: Family Medicine

## 2023-02-24 NOTE — Telephone Encounter (Signed)
ERx 

## 2023-03-01 ENCOUNTER — Emergency Department
Admission: EM | Admit: 2023-03-01 | Discharge: 2023-03-01 | Disposition: A | Discharge status: Home / Self Care | Payer: 59 | Attending: Emergency Medicine | Admitting: Emergency Medicine

## 2023-03-01 ENCOUNTER — Other Ambulatory Visit: Payer: Self-pay

## 2023-03-01 DIAGNOSIS — T7840XA Allergy, unspecified, initial encounter: Secondary | ICD-10-CM | POA: Diagnosis present

## 2023-03-01 DIAGNOSIS — T782XXA Anaphylactic shock, unspecified, initial encounter: Secondary | ICD-10-CM

## 2023-03-01 MED ORDER — PREDNISONE 20 MG PO TABS: 40.0000 mg | ORAL_TABLET | Freq: Every day | ORAL | 0 refills | Status: AC

## 2023-03-01 MED ORDER — EPINEPHRINE 0.3 MG/0.3ML IJ SOAJ: 0.3000 mg | INTRAMUSCULAR | 1 refills | Status: AC | PRN

## 2023-03-01 NOTE — ED Triage Notes (Addendum)
Pt arrives via [facility] from home with c/o allergic reaction from what they are thinking was yellow jackets. Pt was stung by an unknown amt of yellow jackets (est. 12-20) while clearing out brush. Per EMS when they arrived on scene pt was c/o an itchy throat and have visible hives. On arrival to ED pt's hives are still visible but per EMS they are significantly less severe. Pt is A&Ox4 and in NAD at this time.  Pt took 50mg  of Benadryl PO before EMS arrived, pt received 50mg  benadryl IV, .2.3 doses of EPI, 125 of solumedrol, 5mg  of albuterol, and 20mg  of famotidine.

## 2023-03-01 NOTE — ED Notes (Signed)
Patient provided with some food. Resting in bed, voice clear.

## 2023-03-01 NOTE — ED Provider Notes (Signed)
   [facility] Provider Note    Event Date/Time   First MD Initiated Contact with Patient 03/01/23 1850     (approximate)  History   Chief Complaint: Allergic Reaction  HPI  Francisco Mendez is a 42 y.o. male with no significant past medical history presents to the emergency department for an allergic reaction.  According to the patient around 5:20 PM tonight he was working outside when he accidentally got into a [employer].  Patient states he was stung several times.  States approximately an hour later he began experiencing severe flushing, felt lightheaded and dizzy and felt like his throat was swelling and they called EMS.  EMS states upon arrival patient significantly erythematous with complaint of throat swelling patient received 2 doses of epinephrine total of 0.6 mg IM.  Received 125 mg of Solu-Medrol, 50 mg of oral Benadryl, 50 mg of IV Benadryl and 20 mg of IV Pepcid prior to arrival.  Patient receiving IV fluids currently started by EMS.  Here the patient states he is feeling much better, no longer feels swelling in the throat.  Does not feel any shortness of breath.  Denies any vomiting.  Patient denies any prior significant reaction previously.  States last year he was stung by yellow jacket had localized swelling but nothing similar to today.  Physical Exam   Most recent vital signs: There were no vitals filed for this visit.  General: Awake, no distress.  CV:  Good peripheral perfusion.  Regular rate and rhythm  Resp:  Normal effort.  Equal breath sounds bilaterally.  No wheeze. Abd:  No distention.  Soft, nontender.  No rebound or guarding.  ED Results / Procedures / Treatments   MEDICATIONS ORDERED IN ED: Medications - No data to display   IMPRESSION / MDM / ASSESSMENT AND PLAN / ED COURSE  I reviewed the triage vital signs and the nursing notes.  Patient's presentation is most consistent with acute presentation with potential threat to  life or bodily function.  Patient presents to the emergency department after likely anaphylactic reaction to yellowjacket stings.  Patient has received Solu-Medrol, oral and IV Benadryl, IV Pepcid, 2 doses of IM epinephrine prior to arrival.  Patient states he is feeling better no longer feels any swelling in his throat.  Denies any shortness of breath.  Patient is feeling a little jittery.  Patient's chest sounds are clear denies any swelling sensation.  We will continue to closely monitor for 3 to 4 hours in the emergency department to ensure no rebound reaction.  Patient agreeable to plan of care.  Patient has been in the emergency department 3 hours now.  Patient continues to appear well, denies any symptoms.  Patient has eaten dinner without issue.  States he feels well and is ready to go home.  Will discharge her 5 days of prednisone to help prevent any rebound reaction.  Given the patient's significant reaction we will also discharged with an EpiPen prescription in case this happens in the future.  Discussed appropriate use of EpiPen's.  Also discussed return precautions.  Patient agreeable to plan.  FINAL CLINICAL IMPRESSION(S) / ED DIAGNOSES   Allergic reaction   Note:  This document was prepared using Dragon voice recognition software and may include unintentional dictation errors.   Minna Antis, MD 03/01/23 2139

## 2023-03-01 NOTE — ED Notes (Signed)
 Patient discharged from ED by provider. Discharge instructions reviewed with patient and all questions answered. Patient ambulatory from ED in NAD.

## 2023-03-02 ENCOUNTER — Encounter: Payer: Self-pay | Admitting: Family Medicine

## 2023-03-06 ENCOUNTER — Telehealth: Payer: Self-pay

## 2023-03-06 NOTE — Transitions of Care (Post Inpatient/ED Visit) (Signed)
Unable to reach patient by phone and left v/m requesting call back at [phone].        03/06/2023  Name: Francisco Mendez MRN: [id number] DOB: 03-03-1981  Today's TOC FU Call Status: Today's TOC FU Call Status:: Unsuccessful Call (1st Attempt) Unsuccessful Call (1st Attempt) Date: 03/06/23  Attempted to reach the patient regarding the most recent Inpatient/ED visit.  Follow Up Plan: Additional outreach attempts will be made to reach the patient to complete the Transitions of Care (Post Inpatient/ED visit) call.   Signature Lewanda Rife, LPN

## 2023-03-07 NOTE — Transitions of Care (Post Inpatient/ED Visit) (Signed)
Unable to reach patient by phone and left v/m requesting call back at [phone].       03/07/2023  Name: Francisco Mendez MRN: [id number] DOB: April 30, 1981  Today's TOC FU Call Status: Today's TOC FU Call Status:: Unsuccessful Call (2nd Attempt) Unsuccessful Call (1st Attempt) Date: 03/06/23 Unsuccessful Call (2nd Attempt) Date: 03/07/23  Attempted to reach the patient regarding the most recent Inpatient/ED visit.  Follow Up Plan: No further outreach attempts will be made at this time. We have been unable to contact the patient.  Signature Lewanda Rife, LPN

## 2023-04-09 ENCOUNTER — Other Ambulatory Visit: Payer: Self-pay | Admitting: Family Medicine

## 2023-04-09 DIAGNOSIS — E785 Hyperlipidemia, unspecified: Secondary | ICD-10-CM

## 2023-04-14 ENCOUNTER — Other Ambulatory Visit (INDEPENDENT_AMBULATORY_CARE_PROVIDER_SITE_OTHER): Payer: 59

## 2023-04-14 ENCOUNTER — Other Ambulatory Visit: Payer: Self-pay | Admitting: Family Medicine

## 2023-04-14 DIAGNOSIS — E785 Hyperlipidemia, unspecified: Secondary | ICD-10-CM | POA: Diagnosis not present

## 2023-04-20 ENCOUNTER — Other Ambulatory Visit (INDEPENDENT_AMBULATORY_CARE_PROVIDER_SITE_OTHER): Payer: 59

## 2023-04-20 DIAGNOSIS — E785 Hyperlipidemia, unspecified: Secondary | ICD-10-CM | POA: Diagnosis not present

## 2023-04-20 LAB — LIPID PANEL
Cholesterol: 196 mg/dL (ref 0–200)
HDL: 40.9 mg/dL (ref >39.00)
LDL Cholesterol: 102 mg/dL — ABNORMAL HIGH (ref 0–99)
NonHDL: 155.13
Total CHOL/HDL Ratio: 5
Triglycerides: 264 mg/dL — ABNORMAL HIGH (ref 0.0–149.0)
VLDL: 52.8 mg/dL — ABNORMAL HIGH (ref 0.0–40.0)

## 2023-04-20 LAB — COMPREHENSIVE METABOLIC PANEL
ALT: 55 U/L — ABNORMAL HIGH (ref 0–53)
AST: 27 U/L (ref 0–37)
Albumin: 4.8 g/dL (ref 3.5–5.2)
Alkaline Phosphatase: 80 U/L (ref 39–117)
BUN: 16 mg/dL (ref 6–23)
CO2: 30 meq/L (ref 19–32)
Calcium: 9.6 mg/dL (ref 8.4–10.5)
Chloride: 98 meq/L (ref 96–112)
Creatinine, Ser: 1.07 mg/dL (ref 0.40–1.50)
GFR: 86.65 mL/min (ref >60.00)
Glucose, Bld: 101 mg/dL — ABNORMAL HIGH (ref 70–99)
Potassium: 4.1 meq/L (ref 3.5–5.1)
Sodium: 136 meq/L (ref 135–145)
Total Bilirubin: 0.9 mg/dL (ref 0.2–1.2)
Total Protein: 8 g/dL (ref 6.0–8.3)

## 2023-04-21 ENCOUNTER — Ambulatory Visit: Payer: 59 | Admitting: Family Medicine

## 2023-04-21 ENCOUNTER — Encounter: Payer: Self-pay | Admitting: Family Medicine

## 2023-04-21 VITALS — BP 126/74 | HR 78 | Temp 98.0°F | Ht 72.0 in | Wt 214.0 lb

## 2023-04-21 DIAGNOSIS — G4733 Obstructive sleep apnea (adult) (pediatric): Secondary | ICD-10-CM | POA: Diagnosis not present

## 2023-04-21 DIAGNOSIS — Z8249 Family history of ischemic heart disease and other diseases of the circulatory system: Secondary | ICD-10-CM

## 2023-04-21 DIAGNOSIS — Z0000 Encounter for general adult medical examination without abnormal findings: Secondary | ICD-10-CM

## 2023-04-21 DIAGNOSIS — Z85828 Personal history of other malignant neoplasm of skin: Secondary | ICD-10-CM

## 2023-04-21 DIAGNOSIS — E785 Hyperlipidemia, unspecified: Secondary | ICD-10-CM

## 2023-04-21 DIAGNOSIS — F909 Attention-deficit hyperactivity disorder, unspecified type: Secondary | ICD-10-CM

## 2023-04-21 MED ORDER — OMEGA-3-ACID ETHYL ESTERS 1 G PO CAPS: 1.0000 g | ORAL_CAPSULE | Freq: Two times a day (BID) | ORAL | 4 refills | Status: AC

## 2023-04-21 MED ORDER — ATORVASTATIN CALCIUM 40 MG PO TABS: 40.0000 mg | ORAL_TABLET | Freq: Every day | ORAL | 4 refills | Status: DC

## 2023-04-21 NOTE — Assessment & Plan Note (Signed)
Adult ADHD followed by Dr Gala Romney

## 2023-04-21 NOTE — Assessment & Plan Note (Signed)
Chronic, stable on current regimen of atorvastatin 40mg  and Lovaza.  Lp (a) elevated 04/2022 LDL 190 prior to starting atorvastatin. The 10-year ASCVD risk score (Arnett DK, et al., 2019) is: 1.4%   Values used to calculate the score:     Age: 42 years     Sex: Male     Is Non-Hispanic African American: No     Diabetic: No     Tobacco smoker: No     Systolic Blood Pressure: 126 mmHg     Is BP treated: No     HDL Cholesterol: 40.9 mg/dL     Total Cholesterol: 196 mg/dL

## 2023-04-21 NOTE — Assessment & Plan Note (Signed)
Refer back to [place] derm to re establish care

## 2023-04-21 NOTE — Assessment & Plan Note (Signed)
Continue statin, Lovaza

## 2023-04-21 NOTE — Assessment & Plan Note (Addendum)
No trouble since weight loss - sleeping well.

## 2023-04-21 NOTE — Assessment & Plan Note (Signed)
Preventative protocols reviewed and updated unless pt declined. Discussed healthy diet and lifestyle.  

## 2023-04-21 NOTE — Progress Notes (Signed)
Ph: [phone] Fax: [phone]   Patient ID: Francisco Mendez, male    DOB: 06-17-1980, 43 y.o.   MRN: [id number]  This visit was conducted in person.  BP 126/74   Pulse 78   Temp 98 F (36.7 C) (Oral)   Ht 6' (1.829 m)   Wt 214 lb (97.1 kg)   SpO2 97%   BMI 29.02 kg/m    CC: CPE Subjective:   HPI: Francisco Mendez is a 42 y.o. male presenting on 04/21/2023 for Annual Exam   ER visit Oct after got stung by about 30 yellow jackets. Developed anaphylaxis with hypotension and hives and angioedema. Now has Epi pen.    OSA off CPAP - improved with weight loss. No snoring, sleeping well.    Continues adzenys XR for ADHD. This is followed by Doreene Eland at [facility].   HLD - Crestor caused arthralgias, doing better on atorvastatin. Also continues Lovaza. Now jogging 3 miles 2-3 times a week - also resistance training at home.   Strong fmhx CAD - maternal grandparents and uncle.   Preventative: Flu shot - declines  COVID vaccines - declines  Td 2008, 2018, 12/2022 Seat belt use discussed  Sunscreen use discussed. No changing moles on skin. H/o BCC removed from face. Needs to reschedule dermatologist [facility]).  Sleep - averaging 7-8 hours/night  Non smoker. Occasional cigar. No smokeless tobacco.  Alcohol - 1-2 beers a few times a week  Dentist q6 mo  Eye exam - yearly    Caffeine: 2 cups soda/day Lives with wife, 1 daughter (2011), 2 dogs Occupation: was Emergency planning/management officer, now at [employer]  Edu: HS Activity: jogging and push ups 3x/wk Diet: good water, fruits/vegetables, eats meats and potatoes     Relevant past medical, surgical, family and social history reviewed and updated as indicated. Interim medical history since our last visit reviewed. Allergies and medications reviewed and updated. Outpatient Medications Prior to Visit  Medication Sig Dispense Refill   Amphetamine ER (ADZENYS XR-ODT) 15.7 MG TBED Take 1 tablet by mouth daily.      EPINEPHrine (EPIPEN 2-PAK) 0.3 mg/0.3 mL IJ SOAJ injection Inject 0.3 mg into the muscle as needed for anaphylaxis. 1 each 1   Multiple Vitamins-Minerals (MULTIVITAMIN ADULT PO) Take 1 tablet by mouth daily.     mupirocin ointment (BACTROBAN) 2 % Apply 1 Application topically 2 (two) times daily. 22 g 0   atorvastatin (LIPITOR) 40 MG tablet TAKE 1 TABLET BY MOUTH EVERY DAY 90 tablet 0   omega-3 acid ethyl esters (LOVAZA) 1 g capsule Take 1 capsule (1 g total) by mouth 2 (two) times daily. 180 capsule 4   No facility-administered medications prior to visit.     Per HPI unless specifically indicated in ROS section below Review of Systems  Constitutional:  Negative for activity change, appetite change, chills, fatigue, fever and unexpected weight change.  HENT:  Negative for hearing loss.   Eyes:  Negative for visual disturbance.  Respiratory:  Negative for cough, chest tightness, shortness of breath and wheezing.   Cardiovascular:  Negative for chest pain, palpitations and leg swelling.  Gastrointestinal:  Negative for abdominal distention, abdominal pain, blood in stool, constipation, diarrhea, nausea and vomiting.  Genitourinary:  Negative for difficulty urinating and hematuria.  Musculoskeletal:  Negative for arthralgias, myalgias and neck pain.  Skin:  Negative for rash.  Neurological:  Negative for dizziness, seizures, syncope and headaches.  Hematological:  Negative for adenopathy. Does  not bruise/bleed easily.  Psychiatric/Behavioral:  Negative for dysphoric mood. The patient is not nervous/anxious.     Objective:  BP 126/74   Pulse 78   Temp 98 F (36.7 C) (Oral)   Ht 6' (1.829 m)   Wt 214 lb (97.1 kg)   SpO2 97%   BMI 29.02 kg/m   Wt Readings from Last 3 Encounters:  04/21/23 214 lb (97.1 kg)  03/01/23 220 lb (99.8 kg)  04/18/22 214 lb 3.2 oz (97.2 kg)      Physical Exam Vitals and nursing note reviewed.  Constitutional:      General: He is not in acute distress.     Appearance: Normal appearance. He is well-developed. He is not ill-appearing.  HENT:     Head: Normocephalic and atraumatic.     Right Ear: Hearing, tympanic membrane, ear canal and external ear normal.     Left Ear: Hearing, tympanic membrane, ear canal and external ear normal.     Mouth/Throat:     Mouth: Mucous membranes are moist.     Pharynx: Oropharynx is clear. No oropharyngeal exudate or posterior oropharyngeal erythema.  Eyes:     General: No scleral icterus.    Extraocular Movements: Extraocular movements intact.     Conjunctiva/sclera: Conjunctivae normal.     Pupils: Pupils are equal, round, and reactive to light.  Neck:     Thyroid: No thyroid mass or thyromegaly.  Cardiovascular:     Rate and Rhythm: Normal rate and regular rhythm.     Pulses: Normal pulses.          Radial pulses are 2+ on the right side and 2+ on the left side.     Heart sounds: Normal heart sounds. No murmur heard. Pulmonary:     Effort: Pulmonary effort is normal. No respiratory distress.     Breath sounds: Normal breath sounds. No wheezing, rhonchi or rales.  Abdominal:     General: Bowel sounds are normal. There is no distension.     Palpations: Abdomen is soft. There is no mass.     Tenderness: There is no abdominal tenderness. There is no guarding or rebound.     Hernia: No hernia is present.  Musculoskeletal:        General: Normal range of motion.     Cervical back: Normal range of motion and neck supple.     Right lower leg: No edema.     Left lower leg: No edema.  Lymphadenopathy:     Cervical: No cervical adenopathy.  Skin:    General: Skin is warm and dry.     Findings: No rash.  Neurological:     General: No focal deficit present.     Mental Status: He is alert and oriented to person, place, and time.  Psychiatric:        Mood and Affect: Mood normal.        Behavior: Behavior normal.        Thought Content: Thought content normal.        Judgment: Judgment normal.        Results for orders placed or performed in visit on 04/20/23  Comprehensive metabolic panel   Collection Time: 04/20/23  7:51 AM  Result Value Ref Range   Sodium 136 135 - 145 mEq/L   Potassium 4.1 3.5 - 5.1 mEq/L   Chloride 98 96 - 112 mEq/L   CO2 30 19 - 32 mEq/L   Glucose, Bld 101 (H) 70 - 99  mg/dL   BUN 16 6 - 23 mg/dL   Creatinine, Ser 0.34 0.40 - 1.50 mg/dL   Total Bilirubin 0.9 0.2 - 1.2 mg/dL   Alkaline Phosphatase 80 39 - 117 U/L   AST 27 0 - 37 U/L   ALT 55 (H) 0 - 53 U/L   Total Protein 8.0 6.0 - 8.3 g/dL   Albumin 4.8 3.5 - 5.2 g/dL   GFR 74.25 >95.63 mL/min   Calcium 9.6 8.4 - 10.5 mg/dL  Lipid panel   Collection Time: 04/20/23  7:51 AM  Result Value Ref Range   Cholesterol 196 0 - 200 mg/dL   Triglycerides 875.6 (H) 0.0 - 149.0 mg/dL   HDL 43.32 >95.18 mg/dL   VLDL 84.1 (H) 0.0 - 66.0 mg/dL   LDL Cholesterol 630 (H) 0 - 99 mg/dL   Total CHOL/HDL Ratio 5    NonHDL 155.13    Assessment & Plan:   Problem List Items Addressed This Visit     Healthcare maintenance - Primary (Chronic)   Preventative protocols reviewed and updated unless pt declined. Discussed healthy diet and lifestyle.       Dyslipidemia   Chronic, stable on current regimen of atorvastatin 40mg  and Lovaza.  Lp (a) elevated 04/2022 LDL 190 prior to starting atorvastatin. The 10-year ASCVD risk score (Arnett DK, et al., 2019) is: 1.4%   Values used to calculate the score:     Age: 21 years     Sex: Male     Is Non-Hispanic African American: No     Diabetic: No     Tobacco smoker: No     Systolic Blood Pressure: 126 mmHg     Is BP treated: No     HDL Cholesterol: 40.9 mg/dL     Total Cholesterol: 196 mg/dL       Relevant Medications   atorvastatin (LIPITOR) 40 MG tablet   omega-3 acid ethyl esters (LOVAZA) 1 g capsule   OSA (obstructive sleep apnea)   No trouble since weight loss - sleeping well.       Family history of premature CAD   Continue statin, Lovaza      ADHD    Adult ADHD followed by Dr Gala Romney       History of basal cell cancer   Refer back to [place] derm to re establish care      Relevant Orders   Ambulatory referral to Dermatology     Meds ordered this encounter  Medications   atorvastatin (LIPITOR) 40 MG tablet    Sig: Take 1 tablet (40 mg total) by mouth daily.    Dispense:  90 tablet    Refill:  4   omega-3 acid ethyl esters (LOVAZA) 1 g capsule    Sig: Take 1 capsule (1 g total) by mouth 2 (two) times daily.    Dispense:  180 capsule    Refill:  4    Orders Placed This Encounter  Procedures   Ambulatory referral to Dermatology    Referral Priority:   Routine    Referral Type:   Consultation    Referral Reason:   Specialty Services Required    Requested Specialty:   Dermatology    Number of Visits Requested:   1    Patient Instructions  I have placed referral to dermatologist  Good to see you today Return as needed or in 1 year for next physical, prior fasting for labs    Follow up plan: Return in about 1 year (  around 04/20/2024) for annual exam, prior fasting for blood work.  Eustaquio Boyden, MD

## 2023-04-21 NOTE — Patient Instructions (Signed)
I have placed referral to dermatologist  Good to see you today Return as needed or in 1 year for next physical, prior fasting for labs

## 2024-05-08 ENCOUNTER — Telehealth: Payer: Self-pay | Admitting: Family Medicine

## 2024-05-08 NOTE — Telephone Encounter (Signed)
 Copied from CRM #[id number]. Topic: Appointments - Transfer of Care >> May 08, 2024  8:47 AM Thersia BROCKS wrote: Pt is requesting to transfer FROM: Rilla Baller MD  Pt is requesting to transfer TO: Gretel App NP  Reason for requested transfer:  It is the responsibility of the team the patient would like to transfer to (Dr. Gretel) to reach out to the patient if for any reason this transfer is not acceptable.

## 2024-05-08 NOTE — Telephone Encounter (Signed)
 Ok to transfer care. Pleasant patient.

## 2024-05-15 ENCOUNTER — Encounter: Admitting: Family Medicine

## 2024-05-17 ENCOUNTER — Other Ambulatory Visit: Payer: Self-pay | Admitting: Family Medicine

## 2024-05-17 DIAGNOSIS — E785 Hyperlipidemia, unspecified: Secondary | ICD-10-CM

## 2024-05-20 NOTE — Telephone Encounter (Signed)
 ERx

## 2024-08-08 ENCOUNTER — Encounter: Admitting: Nurse Practitioner
